# Patient Record
Sex: Male | Born: 1955 | Race: White | Hispanic: No | Marital: Married | Smoking: Never smoker
Health system: Southern US, Community
[De-identification: ages and names within clinical notes are randomized; demographics above are authoritative.]

## PROBLEM LIST (undated history)

## (undated) DIAGNOSIS — H3320 Serous retinal detachment, unspecified eye: Secondary | ICD-10-CM

## (undated) DIAGNOSIS — C801 Malignant (primary) neoplasm, unspecified: Secondary | ICD-10-CM

## (undated) DIAGNOSIS — H269 Unspecified cataract: Secondary | ICD-10-CM

## (undated) DIAGNOSIS — T7840XA Allergy, unspecified, initial encounter: Secondary | ICD-10-CM

## (undated) DIAGNOSIS — D649 Anemia, unspecified: Secondary | ICD-10-CM

## (undated) HISTORY — DX: Malignant (primary) neoplasm, unspecified: C80.1

## (undated) HISTORY — DX: Anemia, unspecified: D64.9

## (undated) HISTORY — PX: RETINAL DETACHMENT SURGERY: SHX105

## (undated) HISTORY — PX: TONSILLECTOMY: SUR1361

## (undated) HISTORY — DX: Allergy, unspecified, initial encounter: T78.40XA

## (undated) HISTORY — DX: Serous retinal detachment, unspecified eye: H33.20

## (undated) HISTORY — DX: Unspecified cataract: H26.9

---

## 2002-12-14 ENCOUNTER — Encounter: Payer: Self-pay | Admitting: Family Medicine

## 2004-02-05 ENCOUNTER — Ambulatory Visit: Payer: Self-pay | Admitting: Family Medicine

## 2004-07-24 ENCOUNTER — Ambulatory Visit: Payer: Self-pay | Admitting: Family Medicine

## 2005-03-03 ENCOUNTER — Encounter: Admission: RE | Admit: 2005-03-03 | Discharge: 2005-03-03 | Payer: Self-pay | Admitting: Occupational Medicine

## 2005-03-25 ENCOUNTER — Ambulatory Visit: Payer: Self-pay | Admitting: Family Medicine

## 2005-04-22 LAB — FECAL OCCULT BLOOD, GUAIAC: Fecal Occult Blood: NEGATIVE

## 2005-04-23 ENCOUNTER — Ambulatory Visit: Payer: Self-pay | Admitting: Family Medicine

## 2005-06-17 ENCOUNTER — Ambulatory Visit: Payer: Self-pay | Admitting: Family Medicine

## 2006-06-18 ENCOUNTER — Ambulatory Visit: Payer: Self-pay | Admitting: Family Medicine

## 2007-03-09 ENCOUNTER — Encounter: Payer: Self-pay | Admitting: Family Medicine

## 2007-04-08 ENCOUNTER — Ambulatory Visit: Payer: Self-pay | Admitting: Family Medicine

## 2007-04-08 DIAGNOSIS — D649 Anemia, unspecified: Secondary | ICD-10-CM

## 2007-04-11 ENCOUNTER — Encounter: Payer: Self-pay | Admitting: Family Medicine

## 2007-04-22 ENCOUNTER — Ambulatory Visit: Payer: Self-pay | Admitting: Family Medicine

## 2007-04-27 LAB — CONVERTED CEMR LAB
Basophils Relative: 0.8 % (ref 0.0–1.0)
Eosinophils Absolute: 0.1 10*3/uL (ref 0.0–0.6)
Eosinophils Relative: 3 % (ref 0.0–5.0)
Folate: 14.8 ng/mL
MCV: 83.4 fL (ref 78.0–100.0)
Monocytes Absolute: 0.3 10*3/uL (ref 0.2–0.7)
Monocytes Relative: 9.4 % (ref 3.0–11.0)
Neutro Abs: 1.6 10*3/uL (ref 1.4–7.7)
Neutrophils Relative %: 48.2 % (ref 43.0–77.0)
RDW: 15.2 % — ABNORMAL HIGH (ref 11.5–14.6)
Vitamin B-12: 439 pg/mL (ref 211–911)

## 2008-02-23 ENCOUNTER — Ambulatory Visit: Payer: Self-pay | Admitting: Family Medicine

## 2008-02-23 DIAGNOSIS — H5310 Unspecified subjective visual disturbances: Secondary | ICD-10-CM | POA: Insufficient documentation

## 2008-02-24 HISTORY — PX: CATARACT EXTRACTION: SUR2

## 2008-02-27 ENCOUNTER — Encounter: Payer: Self-pay | Admitting: Family Medicine

## 2008-02-27 LAB — CONVERTED CEMR LAB
BUN: 14 mg/dL (ref 6–23)
Basophils Relative: 0.5 % (ref 0.0–3.0)
CO2: 34 meq/L — ABNORMAL HIGH (ref 19–32)
Creatinine, Ser: 0.9 mg/dL (ref 0.4–1.5)
GFR calc Af Amer: 115 mL/min
Glucose, Bld: 110 mg/dL — ABNORMAL HIGH (ref 70–99)
Hemoglobin: 14.8 g/dL (ref 13.0–17.0)
LDL Cholesterol: 96 mg/dL (ref 0–99)
MCHC: 34.8 g/dL (ref 30.0–36.0)
Monocytes Absolute: 0.3 10*3/uL (ref 0.1–1.0)
Monocytes Relative: 7.4 % (ref 3.0–12.0)
Platelets: 150 10*3/uL (ref 150–400)
RDW: 13.6 % (ref 11.5–14.6)
Total CHOL/HDL Ratio: 2.6
WBC: 4.4 10*3/uL — ABNORMAL LOW (ref 4.5–10.5)

## 2008-03-01 ENCOUNTER — Telehealth: Payer: Self-pay | Admitting: Family Medicine

## 2008-03-06 ENCOUNTER — Encounter: Admission: RE | Admit: 2008-03-06 | Discharge: 2008-03-06 | Payer: Self-pay | Admitting: Family Medicine

## 2008-04-24 ENCOUNTER — Ambulatory Visit: Payer: Self-pay | Admitting: Family Medicine

## 2008-06-15 ENCOUNTER — Ambulatory Visit: Payer: Self-pay | Admitting: Family Medicine

## 2008-07-02 ENCOUNTER — Ambulatory Visit: Payer: Self-pay | Admitting: Ophthalmology

## 2008-09-03 ENCOUNTER — Ambulatory Visit: Payer: Self-pay | Admitting: Gastroenterology

## 2008-09-05 ENCOUNTER — Encounter: Payer: Self-pay | Admitting: Gastroenterology

## 2008-09-28 ENCOUNTER — Ambulatory Visit: Payer: Self-pay | Admitting: Gastroenterology

## 2009-03-12 ENCOUNTER — Ambulatory Visit: Payer: Self-pay | Admitting: Family Medicine

## 2009-03-14 LAB — CONVERTED CEMR LAB
Basophils Absolute: 0 10*3/uL (ref 0.0–0.1)
HCT: 41.3 % (ref 39.0–52.0)
Lymphocytes Relative: 32.7 % (ref 12.0–46.0)
MCHC: 33.3 g/dL (ref 30.0–36.0)
Monocytes Absolute: 0.3 10*3/uL (ref 0.1–1.0)
Monocytes Relative: 6.6 % (ref 3.0–12.0)
Neutro Abs: 2.2 10*3/uL (ref 1.4–7.7)

## 2009-07-25 ENCOUNTER — Ambulatory Visit: Payer: Self-pay | Admitting: Family Medicine

## 2009-07-29 ENCOUNTER — Ambulatory Visit: Payer: Self-pay | Admitting: Family Medicine

## 2009-07-30 LAB — CONVERTED CEMR LAB
ALT: 16 units/L (ref 0–53)
AST: 22 units/L (ref 0–37)
Albumin: 4 g/dL (ref 3.5–5.2)
Basophils Absolute: 0 10*3/uL (ref 0.0–0.1)
Bilirubin, Direct: 0.2 mg/dL (ref 0.0–0.3)
CO2: 31 meq/L (ref 19–32)
GFR calc non Af Amer: 96.75 mL/min (ref >60)
HDL: 68.8 mg/dL (ref >39.00)
Hemoglobin: 13 g/dL (ref 13.0–17.0)
Lymphocytes Relative: 30.6 % (ref 12.0–46.0)
PSA: 1.1 ng/mL (ref 0.10–4.00)
Platelets: 139 10*3/uL — ABNORMAL LOW (ref 150.0–400.0)
RBC: 4.36 M/uL (ref 4.22–5.81)
RDW: 14.8 % — ABNORMAL HIGH (ref 11.5–14.6)
Sodium: 144 meq/L (ref 135–145)
Total Protein: 6.2 g/dL (ref 6.0–8.3)
VLDL: 5.4 mg/dL (ref 0.0–40.0)
WBC: 4.4 10*3/uL — ABNORMAL LOW (ref 4.5–10.5)

## 2009-07-31 ENCOUNTER — Ambulatory Visit: Payer: Self-pay | Admitting: Family Medicine

## 2009-07-31 DIAGNOSIS — J45909 Unspecified asthma, uncomplicated: Secondary | ICD-10-CM

## 2010-03-23 LAB — CONVERTED CEMR LAB
Albumin: 4.2 g/dL (ref 3.5–5.2)
Alkaline Phosphatase: 32 units/L — ABNORMAL LOW (ref 39–117)
Bilirubin, Direct: 0.2 mg/dL (ref 0.0–0.3)
Calcium: 9 mg/dL (ref 8.4–10.5)
Creatinine, Ser: 0.9 mg/dL (ref 0.40–1.50)
Eosinophils Relative: 3 % (ref 0–5)
Indirect Bilirubin: 0.5 mg/dL (ref 0.0–0.9)
Lymphocytes Relative: 30 % (ref 12–46)
Lymphs Abs: 1.3 10*3/uL (ref 0.7–4.0)
MCHC: 34.3 g/dL (ref 30.0–36.0)
Monocytes Absolute: 0.3 10*3/uL (ref 0.1–1.0)
Monocytes Relative: 7 % (ref 3–12)
Neutro Abs: 2.7 10*3/uL (ref 1.7–7.7)
Neutrophils Relative %: 60 % (ref 43–77)
PSA: 1 ng/mL (ref 0.10–4.00)
Sodium: 140 meq/L (ref 135–145)
Total Bilirubin: 0.7 mg/dL (ref 0.3–1.2)
WBC: 4.5 10*3/uL (ref 4.0–10.5)

## 2010-03-25 NOTE — Assessment & Plan Note (Signed)
Summary: CPX/CLE   Vital Signs:  Patient profile:   55 year old male Height:      69 inches Weight:      146.75 pounds BMI:     21.75 Temp:     97.8 degrees F oral Pulse rate:   60 / minute Pulse rhythm:   regular BP sitting:   100 / 70  (left arm) Cuff size:   regular  Vitals Entered By: Lewanda Rife LPN (July 31, 8117 9:53 AM) CC: CPX   History of Present Illness: here for health mt exam and to rev chronic med problems   feels well overall  no new problems or questions    wt is down 2 lb  bp great 100/70  lipids very good with trig 27/ HDL 68 and LDL90- great   hb 13- no anemia -- does still donate blood  platelets 139- this is stable for him  no unusual bruising or bleeding   nl psa  no prostate problems/ urinary changes or nocturia   colonosc nl 8/10- rec 10 y f/u   Td 09  feels like advair helped -- and needs to go back on it  some wheezing at night and in extreme temps   Allergies (verified): No Known Drug Allergies  Past History:  Past Medical History: Last updated: 02/23/2008 Allergic rhinitis Asthma mild anemia from blood donation  Past Surgical History: Last updated: 03/07/2008 MRI/MRA brain (01/10)- no vascular changes (some chronic sinus changes)  Family History: Last updated: 07/31/2009 Father:  Mother:  Siblings: 7 sister had brain aneurysm -- she died  brother has cancer -- lung (smoker)   Social History: Last updated: 02/23/2008 Marital Status: Married Children: 1 daughter Occupation: copy Programmer, 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 Eyes:  Denies blurring and eye pain. CV:  Denies chest pain or discomfort, palpitations, shortness of breath with exertion,  and swelling of feet. Resp:  Denies cough, pleuritic, and wheezing. GI:  Denies abdominal pain, change in bowel habits, indigestion, nausea, and vomiting. GU:  Denies dysuria, nocturia, urinary frequency, and urinary hesitancy. MS:  Denies joint pain, joint redness, joint swelling, and cramps. Derm:  Denies itching, lesion(s), poor wound healing, and rash. Neuro:  Denies numbness and tingling. Psych:  Denies anxiety and depression. Endo:  Denies cold intolerance, excessive thirst, excessive urination, and heat intolerance. Heme:  Denies abnormal bruising and bleeding.  Physical Exam  General:  Well-developed,well-nourished,in no acute distress; alert,appropriate and cooperative throughout examination Head:  normocephalic, atraumatic, and no abnormalities observed.   Eyes:  vision grossly intact, pupils equal, pupils round, and pupils reactive to light.  no conjunctival pallor, injection or icterus  Ears:  R ear normal and L ear normal.   Nose:  nares are boggy but clear  Mouth:  pharynx pink and moist, no erythema, and no exudates.   Neck:  supple with full rom and no masses or thyromegally, no JVD or carotid bruit  Chest Wall:  No deformities, masses, tenderness or gynecomastia noted. Lungs:  Normal respiratory effort, chest expands symmetrically. Lungs are clear to auscultation, no crackles or wheezes. Heart:  Normal rate and regular rhythm. S1 and S2 normal without gallop, murmur, click, rub or other extra sounds. Abdomen:  Bowel sounds positive,abdomen soft and non-tender without masses, organomegaly or hernias noted. no renal bruits  Rectal:  No external abnormalities noted. Normal sphincter tone. No rectal masses or tenderness. Prostate:  Prostate gland firm and smooth, no enlargement, nodularity, tenderness, mass, asymmetry or induration. Msk:  No deformity or scoliosis noted of thoracic or lumbar spine.  no acute joint changes  Pulses:  R and L carotid,radial,femoral,dorsalis pedis  and posterior tibial pulses are full and equal bilaterally Extremities:  No clubbing, cyanosis, edema, or deformity noted with normal full range of motion of all joints.   Neurologic:  sensation intact to light touch, gait normal, and DTRs symmetrical and normal.      Impression & Recommendations:  Problem # 1:  HEALTH MAINTENANCE EXAM (ICD-V70.0) Assessment Comment Only reviewed health habits including diet, exercise and skin cancer prevention reviewed health maintenance list and family history  commended on good habits   Problem # 2:  SPECIAL SCREENING MALIGNANT NEOPLASM OF PROSTATE (ICD-V76.44) Assessment: Comment Only exam done and nl psa no symptoms   Problem # 3:  ASTHMA (ICD-493.90) Assessment: Deteriorated triggered by temp / sometimes exercise / night  re start advair and update disc use of rescue inhaler as needed when needed  His updated medication list for this problem includes:    Advair Diskus 100-50 Mcg/dose Aepb (Fluticasone-salmeterol) .Marland Kitchen... 1 inhalation two times a day as directed  Complete Medication List: 1)  Flonase 50 Mcg/act Susp (Fluticasone propionate) .... 2 sprays in each nostril once daily 2)  Claritin 10 Mg Tabs (Loratadine) .... One by mouth daily as needed 3)  Aspirin 81 Mg Tabs (Aspirin) .... Take 1 tablet by mouth once a day 4)  Advair Diskus 100-50 Mcg/dose Aepb (Fluticasone-salmeterol) .Marland Kitchen.. 1 inhalation two times a day as directed  Patient Instructions: 1)  keep up the good work with healthy diet and exercise  2)  no change in medicines  3)  go back on advair and update me if no improvement  Prescriptions: ADVAIR DISKUS 100-50 MCG/DOSE AEPB (FLUTICASONE-SALMETEROL) 1 inhalation two times a day as directed  #3 diskus x 3   Entered and Authorized by:   Judith Part MD   Signed by:   Judith Part MD on 07/31/2009   Method used:   Print then Give to Patient   RxID:   [id number] FLONASE 50 MCG/ACT  SUSP (FLUTICASONE PROPIONATE) 2  sprays in each nostril once daily  #3 mdi x 3   Entered and Authorized by:   Judith Part MD   Signed by:   Judith Part MD on 07/31/2009   Method used:   Print then Give to Patient   RxID:   [id number]   Current Allergies (reviewed today): No known allergies

## 2010-03-25 NOTE — Assessment & Plan Note (Signed)
Summary: right elbow per dr. Scotty Court   Vital Signs:  Patient profile:   55 year old male Height:      69 inches Weight:      148.6 pounds BMI:     22.02 Temp:     98.2 degrees F oral Pulse rate:   68 / minute Pulse rhythm:   regular BP sitting:   110 / 72  (left arm) Cuff size:   regular  Vitals Entered By: Benny Lennert CMA Duncan Dull) (July 25, 2009 11:56 AM)  History of Present Illness: Chief complaint Right elbow pain  55 year old male with R elbow pain:  patient seen at the request of Dr. Milinda Antis for evaluation of right elbow pain, and has been ongoing for 6 months. He was doing some home placing him chin-ups, and at that time he felt a significant amount of right elbow assertional  pain at the medial upper condyle. Since that time he has had continued pain and some dysfunction  at the medial upper condyle, particular pronation and pain in the flexor tendon.  He is trying to modify her activity somewhat, but his lifting technique, and continues to have some difficulty.    REVIEW OF SYSTEMS  GEN: No systemic complaints, no fevers, chills, sweats, or other acute illnesses MSK: Detailed in the HPI GI: tolerating PO intake without difficulty Neuro: No numbness, parasthesias, or tingling associated. Otherwise the pertinent positives of the ROS are noted above.    Allergies (verified): No Known Drug Allergies  Past History:  Past medical, surgical, family and social histories (including risk factors) reviewed, and no changes noted (except as noted below).  Past Medical History: Reviewed history from 02/23/2008 and no changes required. Allergic rhinitis Asthma mild anemia from blood donation  Past Surgical History: Reviewed history from 03/07/2008 and no changes required. MRI/MRA brain (01/10)- no vascular changes (some chronic sinus changes)  Family History: Reviewed history from 02/23/2008 and no changes required. Father:  Mother:  Siblings: 7 sister had brain  aneurysm   Social History: Reviewed history from 02/23/2008 and no changes required. 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 No apparent alopecia or balding. Ears, externally no deformities PULM: Breathing comfortably in no respiratory distress EXT: No clubbing, cyanosis, or edema PSYCH: Normally interactive. Cooperative during the interview. Pleasant. Friendly and conversant. Not anxious or depressed appearing. Normal, full affect.   Msk:  left elbow: Full range of motion at the elbow with flexion, extension pronation and supination. Strength is 5/5 in his elbow. Nontender at the medial or lateral epicondyles. Grossly normal examination.  Right elbow: Full range of motion with extension, flexion, pronation  and supination. Stable to valgus stress. Tinel's at the elbow is negative. Nontender at the lateral epicondyles lateral epicondylar insertion at the ecrb. Patient does have tenderness at the common flexor tendon insertion at the medial epicondyle  he also has pain at the active pronation   Impression & Recommendations:  Problem # 1:  MEDIAL EPICONDYLITIS, RIGHT (ICD-726.31) Assessment New >25 minutes spent in face to face time with patient, >50% spent in counselling or coordination of care  classic history and examination. I suspected this is a case of the patient had a partial  rupture or partial  insertional  rupture at his medial upper condyle  and had this  initial episode of significant pain. Since that time I would suspect that he has had healing or partial healing with deposition of calcium and now has developed some chronic tendinopathy and weakness in this area.  I reviewed the relevent anatomy  with the patient using [employer] of Human Anatomy. The patient indicated that they understood the involved structures.   Pt. given a formal rehab program from Dr. Caralee Ates at [facility] and the [facility] on elbow rehabiliation.  Series of concentric and eccentric exercises should be done starting with no weight, work up to 1 lb, hammer, etc. Use counterforce strap if working or using hands: Aircast band given  Overall, mild case. f/u if no improvement.  Complete Medication List: 1)  Flonase 50 Mcg/act Susp (Fluticasone propionate) .... Use as directed 2)  Claritin 10 Mg Tabs (Loratadine) .... One by mouth daily as needed 3)  Aspirin 81 Mg Tabs (Aspirin) .... Take 1 tablet by mouth once a day  Current Allergies (reviewed today): No known allergies

## 2010-03-25 NOTE — Assessment & Plan Note (Signed)
Summary: SWALLOW AREA BEHIND EAR  CYD   Vital Signs:  Patient profile:   55 year old male Weight:      153 pounds Temp:     97.9 degrees F oral Pulse rate:   68 / minute Pulse rhythm:   regular BP sitting:   110 / 78  (left arm) Cuff size:   regular  Vitals Entered By: Lowella Petties CMA (March 12, 2009 3:02 PM) CC: Swollen area behind right ear x 6 weeks.   History of Present Illness: had a spot come up behind his ear -- late november -- watched it for a while  is not getting bigger does not hurt or itch  no ear problems - or pain did have a cold and got over that   no other lumps or spots   feels ok - no fever   had an itchy spot on base of penis -- last few days -- used antifungal and it is getting better    Allergies: No Known Drug Allergies  Past History:  Past Medical History: Last updated: 02/23/2008 Allergic rhinitis Asthma mild anemia from blood donation  Past Surgical History: Last updated: 03/07/2008 MRI/MRA brain (01/10)- no vascular changes (some chronic sinus changes)  Family History: Last updated: 02/23/2008 Father:  Mother:  Siblings: 7 sister had brain aneurysm   Social History: Last updated: 02/23/2008 Marital Status: Married Children: 1 daughter Occupation: copy Programmer, 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 Eyes:  Denies blurring, discharge, and eye irritation. ENT:  Denies ear discharge, earache, postnasal drainage, sinus pressure, and sore throat. CV:  Denies chest pain or discomfort and palpitations. Resp:  Denies cough. GI:  Denies abdominal pain and change in bowel habits. GU:  Denies urinary frequency. MS:  Denies joint pain. Derm:  Denies itching, lesion(s), poor wound healing, and rash. Heme:  Denies abnormal bruising, bleeding, fevers, pallor, and skin  discoloration.  Physical Exam  General:  Well-developed,well-nourished,in no acute distress; alert,appropriate and cooperative throughout examination Head:  normocephalic, atraumatic, and no abnormalities observed.  no sinus tenderness  Eyes:  vision grossly intact, pupils equal, pupils round, and pupils reactive to light.  no conjunctival pallor, injection or icterus  Ears:  R ear normal and L ear normal.   Nose:  no nasal discharge.   Mouth:  pharynx pink and moist.   Neck:  soft 1 cm mass- mobile and nontender ,behind R ear  no color or skin change is just under divot from arm of glasses  Chest Wall:  No deformities, masses, tenderness or gynecomastia noted. Lungs:  Normal respiratory effort, chest expands symmetrically. Lungs are clear to auscultation, no crackles or wheezes. Heart:  Normal rate and regular rhythm. S1 and S2 normal without gallop, murmur, click, rub or other extra sounds. Abdomen:  Bowel sounds positive,abdomen soft and non-tender without masses, organomegaly or hernias noted. Msk:  No deformity or scoliosis noted of thoracic or lumbar spine.   Skin:  small 3-4 mm erythematous papule / some central clearing on L shaft of penis no other rash Cervical Nodes:  see neck exam  Axillary Nodes:  No palpable lymphadenopathy Inguinal Nodes:  No significant adenopathy Psych:  normal affect, talkative and pleasant    Impression & Recommendations:  Problem # 1:  NEOPLASMS UNSPEC NATURE BONE SOFT TISSUE&SKIN (ICD-239.2) Assessment New  lump behind R ear -- differential includes  lymph node / lipoma/ or scar tissue from arm of glasses  no other findings  no adenopathy elsewhere check cbc today  continue to monitor for growth or change -- consider ENT ref if bigger   Orders: Venipuncture (69629) TLB-CBC Platelet - w/Differential (85025-CBCD)  Problem # 2:  TINEA CORPORIS (ICD-110.5) Assessment: New small area on shaft of penis - appears to be resolving with otc cream  antifungal adv to continue this and keep as dry as possible update if not gone in 2 wk  Complete Medication List: 1)  Flonase 50 Mcg/act Susp (Fluticasone propionate) .... Use as directed 2)  Claritin 10 Mg Tabs (Loratadine) .... One by mouth daily as needed 3)  Aspirin 81 Mg Tabs (Aspirin) .... Take 1 tablet by mouth once a day  Patient Instructions: 1)  soft tissue mass behind ear may be lymph node/ scar tissue or fatty deposit 2)  will check labs today- complete blood count - and update you with result  3)  watch closely for change or growth or pain  4)  follow up in about 3 months for re check 5)  continue using antifungal cream on area of rash- keep it clean and dry   Prior Medications (reviewed today): FLONASE 50 MCG/ACT  SUSP (FLUTICASONE PROPIONATE) use as directed CLARITIN 10 MG TABS (LORATADINE) one by mouth daily as needed ASPIRIN 81 MG  TABS (ASPIRIN) Take 1 tablet by mouth once a day Current Allergies: No known allergies

## 2010-04-04 ENCOUNTER — Ambulatory Visit: Payer: Self-pay | Admitting: Ophthalmology

## 2010-05-24 ENCOUNTER — Encounter: Payer: Self-pay | Admitting: Family Medicine

## 2010-05-27 ENCOUNTER — Ambulatory Visit (INDEPENDENT_AMBULATORY_CARE_PROVIDER_SITE_OTHER): Payer: BC Managed Care – PPO | Admitting: Family Medicine

## 2010-05-27 ENCOUNTER — Encounter: Payer: Self-pay | Admitting: Family Medicine

## 2010-05-27 VITALS — BP 124/72 | HR 72 | Temp 98.2°F | Resp 20 | Ht 68.0 in | Wt 150.8 lb

## 2010-05-27 DIAGNOSIS — Z0100 Encounter for examination of eyes and vision without abnormal findings: Secondary | ICD-10-CM

## 2010-05-27 DIAGNOSIS — H3320 Serous retinal detachment, unspecified eye: Secondary | ICD-10-CM

## 2010-05-27 DIAGNOSIS — Z0289 Encounter for other administrative examinations: Secondary | ICD-10-CM | POA: Insufficient documentation

## 2010-05-27 DIAGNOSIS — Z0110 Encounter for examination of ears and hearing without abnormal findings: Secondary | ICD-10-CM

## 2010-05-27 DIAGNOSIS — Z0000 Encounter for general adult medical examination without abnormal findings: Secondary | ICD-10-CM

## 2010-05-27 LAB — POCT URINALYSIS DIPSTICK: Leukocytes, UA: NEGATIVE

## 2010-05-27 NOTE — Progress Notes (Signed)
Subjective:    Patient ID: Matthew Snyder, male    DOB: Apr 23, 1955, 55 y.o.   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 Allergic rhinitis   . Asthma   . Anemia     mild, from blood transfusion  . Retinal detachment    Past Surgical History  Procedure Date  . Retinal detachment surgery     reports that he has never smoked. He does not have any smokeless tobacco history on file. His alcohol and drug histories not on file. family history includes Aneurysm in his sister and Tracheal cancer in his brother. No Known Allergies  History   Social History  . Marital Status: Married    Spouse Name: N/A    Number of Children: 1  . Years of Education: N/A   Occupational History  . copy editor    Social History Main Topics  . Smoking status: Never Smoker   . Smokeless tobacco: Not on file  . Alcohol Use: Not on file     rare  . Drug Use: Not on file  . Sexually Active: Not on file   Other Topics Concern  . Not on file   Social History Narrative   Regular exercise.--runs a lot Runs 1/2 marathons     Family History  Problem Relation Age of Onset  . Aneurysm Sister     brain  . Tracheal cancer Brother     lung, smoker       Review of Systems  Constitutional: Negative for activity change, appetite change, fatigue and unexpected weight change.  Eyes: Positive for visual disturbance. Negative for pain, discharge  and redness.  Respiratory: Negative for cough, shortness of breath and wheezing.   Cardiovascular: Negative.   Gastrointestinal: Negative for nausea, abdominal pain and diarrhea.  Genitourinary: Positive for frequency. Negative for urgency.  Musculoskeletal: Negative for myalgias, back pain and arthralgias.  Skin: Negative for color change, pallor and wound.  Neurological: Negative for tremors, weakness, light-headedness and headaches.  Hematological: Negative for adenopathy. Does not bruise/bleed easily.  Psychiatric/Behavioral: Negative for dysphoric mood. The patient is not nervous/anxious.        Objective:   Physical Exam  Constitutional: He appears well-developed and well-nourished. No distress.  HENT:  Head: Normocephalic and atraumatic.  Right Ear: External ear normal.  Left Ear: External ear normal.  Nose: Nose normal.  Mouth/Throat: Oropharynx is clear and moist.  Eyes: Conjunctivae and EOM are normal. Pupils are equal, round, and reactive to light.  Neck: Normal range of motion. Neck supple. No JVD present. No thyromegaly present.  Cardiovascular: Normal rate, regular rhythm and normal heart sounds.   Pulmonary/Chest: Effort normal and breath sounds normal. No respiratory distress. He has no wheezes. He exhibits no tenderness.  Abdominal: Soft. Bowel sounds are normal. He  exhibits no distension and no mass.  Musculoskeletal: Normal range of motion. He exhibits no tenderness.  Lymphadenopathy:    He has no cervical adenopathy.  Neurological: He is alert. He displays normal reflexes. No cranial nerve deficit. He exhibits normal muscle tone.  Skin: Skin is warm. No rash noted. No erythema. No pallor.  Psychiatric: He has a normal mood and affect.          Assessment & Plan:

## 2010-05-27 NOTE — Assessment & Plan Note (Signed)
Exam for nursing school No restritctions above any opthy restrictions for detatched retina-- expect further improvement in vision  ua nl / hearing nl  Rev nl labs from last June  Getting imms from health dept

## 2010-05-27 NOTE — Patient Instructions (Signed)
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

## 2010-05-30 ENCOUNTER — Encounter: Payer: Self-pay | Admitting: Family Medicine

## 2010-05-30 DIAGNOSIS — Z8669 Personal history of other diseases of the nervous system and sense organs: Secondary | ICD-10-CM | POA: Insufficient documentation

## 2010-08-08 ENCOUNTER — Other Ambulatory Visit: Payer: Self-pay | Admitting: *Deleted

## 2010-08-08 MED ORDER — FLUTICASONE-SALMETEROL 100-50 MCG/DOSE IN AEPB: 1.0000 | INHALATION_SPRAY | Freq: Two times a day (BID) | RESPIRATORY_TRACT | Status: DC

## 2010-12-11 ENCOUNTER — Encounter: Payer: Self-pay | Admitting: Family Medicine

## 2011-03-09 ENCOUNTER — Other Ambulatory Visit: Payer: Self-pay | Admitting: Internal Medicine

## 2011-03-09 MED ORDER — FLUTICASONE-SALMETEROL 100-50 MCG/DOSE IN AEPB: 1.0000 | INHALATION_SPRAY | Freq: Two times a day (BID) | RESPIRATORY_TRACT | Status: DC

## 2011-03-09 NOTE — Telephone Encounter (Signed)
Will send to [facility] electronically-let him know it was sent ,thanks

## 2011-03-09 NOTE — Telephone Encounter (Signed)
Patient's wife notified as instructed by telephone. 

## 2011-03-09 NOTE — Telephone Encounter (Signed)
Patient would like this sent to [facility] because he can receive it cheaper and would like a 90 day supply

## 2011-09-27 ENCOUNTER — Telehealth: Payer: Self-pay | Admitting: Family Medicine

## 2011-09-27 DIAGNOSIS — N400 Enlarged prostate without lower urinary tract symptoms: Secondary | ICD-10-CM | POA: Insufficient documentation

## 2011-09-27 DIAGNOSIS — Z0000 Encounter for general adult medical examination without abnormal findings: Secondary | ICD-10-CM | POA: Insufficient documentation

## 2011-09-27 DIAGNOSIS — Z125 Encounter for screening for malignant neoplasm of prostate: Secondary | ICD-10-CM

## 2011-09-27 NOTE — Telephone Encounter (Signed)
Message copied by Judy Pimple on Sun Sep 27, 2011  9:25 PM ------      Message from: Alvina Chou      Created: Tue Sep 22, 2011  3:35 PM      Regarding: Lab orders for, Monday 8.5.13       Patient is scheduled for CPX labs, please order future labs, Thanks , Camelia Eng

## 2011-10-02 ENCOUNTER — Other Ambulatory Visit (INDEPENDENT_AMBULATORY_CARE_PROVIDER_SITE_OTHER): Payer: BC Managed Care – PPO

## 2011-10-02 DIAGNOSIS — Z125 Encounter for screening for malignant neoplasm of prostate: Secondary | ICD-10-CM

## 2011-10-02 DIAGNOSIS — Z0000 Encounter for general adult medical examination without abnormal findings: Secondary | ICD-10-CM

## 2011-10-02 LAB — CBC WITH DIFFERENTIAL/PLATELET
HCT: 42.2 % (ref 39.0–52.0)
Hemoglobin: 14 g/dL (ref 13.0–17.0)
MCV: 88.4 fl (ref 78.0–100.0)
Monocytes Relative: 9.9 % (ref 3.0–12.0)
Platelets: 134 10*3/uL — ABNORMAL LOW (ref 150.0–400.0)
RBC: 4.77 Mil/uL (ref 4.22–5.81)
RDW: 14.5 % (ref 11.5–14.6)
WBC: 3.5 10*3/uL — ABNORMAL LOW (ref 4.5–10.5)

## 2011-10-02 LAB — TSH: TSH: 2.42 u[IU]/mL (ref 0.35–5.50)

## 2011-10-02 LAB — COMPREHENSIVE METABOLIC PANEL
AST: 24 U/L (ref 0–37)
Alkaline Phosphatase: 31 U/L — ABNORMAL LOW (ref 39–117)
Calcium: 9 mg/dL (ref 8.4–10.5)
Chloride: 102 mEq/L (ref 96–112)
Creatinine, Ser: 0.8 mg/dL (ref 0.4–1.5)
Glucose, Bld: 88 mg/dL (ref 70–99)
Potassium: 3.9 mEq/L (ref 3.5–5.1)
Total Bilirubin: 0.9 mg/dL (ref 0.3–1.2)

## 2011-10-02 LAB — LIPID PANEL
LDL Cholesterol: 88 mg/dL (ref 0–99)
Triglycerides: 35 mg/dL (ref 0.0–149.0)
VLDL: 7 mg/dL (ref 0.0–40.0)

## 2011-10-09 ENCOUNTER — Ambulatory Visit (INDEPENDENT_AMBULATORY_CARE_PROVIDER_SITE_OTHER): Payer: BC Managed Care – PPO | Admitting: Family Medicine

## 2011-10-09 ENCOUNTER — Encounter: Payer: Self-pay | Admitting: Family Medicine

## 2011-10-09 VITALS — BP 102/64 | HR 64 | Temp 97.9°F | Ht 68.0 in | Wt 145.2 lb

## 2011-10-09 DIAGNOSIS — Z125 Encounter for screening for malignant neoplasm of prostate: Secondary | ICD-10-CM

## 2011-10-09 DIAGNOSIS — Z0000 Encounter for general adult medical examination without abnormal findings: Secondary | ICD-10-CM

## 2011-10-09 NOTE — Patient Instructions (Addendum)
Labs are stable  Cholesterol is very good  Remember to eat regular meals , even when busy or stressed  Keep exercising  Update me if urinary status changes please

## 2011-10-09 NOTE — Progress Notes (Signed)
Subjective:    Patient ID: Matthew Snyder, male    DOB: 1955-11-07, 56 y.o.   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   psa is 1.98- in nl limits Still has some frequency of urination -- is aware of it , but not worse  No urol visit  Last was 1.1  colonosc 8/10  Lab Results  Component Value Date   WBC 3.5* 10/02/2011   HGB 14.0 10/02/2011   HCT 42.2 10/02/2011   MCV 88.4 10/02/2011   PLT 134.0* 10/02/2011   platelets are stable No major bruising and bleeding     Diet-is eating well and healthy   Exercise- is still running - up to 10 miles   Lab Results  Component Value Date   CHOL 168 10/02/2011   CHOL 164 07/29/2009   CHOL 172 02/23/2008   Lab Results  Component Value Date   HDL 73.00 10/02/2011   HDL 68.80 07/29/2009   HDL 66.0 02/23/2008   Lab Results  Component Value Date   LDLCALC 88 10/02/2011   LDLCALC 90 07/29/2009   LDLCALC 96 02/23/2008   Lab Results  Component Value Date   TRIG 35.0 10/02/2011   TRIG 27.0 07/29/2009   TRIG 49 02/23/2008   Lab Results  Component Value Date   CHOLHDL 2 10/02/2011   CHOLHDL 2 07/29/2009   CHOLHDL 2.6 CALC 02/23/2008   No results found for this basename: LDLDIRECT     Patient Active Problem List  Diagnosis  . ANEMIA  . UNSPECIFIED SUBJECTIVE VISUAL DISTURBANCE  . ASTHMA  . Other general medical examination for administrative purposes  . Retinal detachment  . Routine general medical examination at a health care facility  . Prostate cancer screening   Past Medical History  Diagnosis Date  . Allergic rhinitis   . Asthma   . Anemia     mild, from blood transfusion  . Retinal detachment    Past Surgical History  Procedure Date  . Retinal detachment surgery     History  Substance Use Topics  . Smoking status: Never Smoker   . Smokeless tobacco: Not on file  . Alcohol Use: Yes     rare   Family History  Problem Relation Age of Onset  . Aneurysm Sister     brain  . Tracheal cancer Brother     lung, smoker   No Known Allergies Current Outpatient Prescriptions on File Prior to Visit  Medication Sig Dispense Refill  . aspirin 81 MG tablet Take 81 mg by mouth daily.        . fluticasone (FLONASE) 50 MCG/ACT nasal spray 2 sprays in each nostril daily       . Fluticasone-Salmeterol (ADVAIR DISKUS) 100-50 MCG/DOSE AEPB Inhale 1 puff into the lungs 2 (two) times daily.  3 each  3  . loratadine (CLARITIN) 10 MG tablet Take 10 mg by mouth daily.             Review of Systems Review of Systems  Constitutional: Negative for fever, appetite change, fatigue and unexpected weight change.  Eyes: Negative for pain and visual disturbance.  Respiratory: Negative for cough and  shortness of breath.   Cardiovascular: Negative for cp or palpitations    Gastrointestinal: Negative for nausea, diarrhea and constipation.  Genitourinary: Negative for urgency and no change in frequency  Skin: Negative for pallor or rash   Neurological: Negative for weakness, light-headedness, numbness and headaches.  Hematological: Negative for adenopathy. Does not bruise/bleed easily.  Psychiatric/Behavioral: Negative for dysphoric mood. The patient is not nervous/anxious.         Objective:   Physical Exam  Constitutional: He appears well-developed and well-nourished. No distress.  HENT:  Head: Normocephalic and atraumatic.  Right Ear: External ear normal.  Left Ear: External ear normal.  Nose: Nose normal.  Mouth/Throat: Oropharynx is clear and moist.  Eyes: Conjunctivae and EOM are normal. Pupils are equal, round, and reactive to light. No scleral icterus.  Neck: Normal range of motion. Neck supple. No JVD present. Carotid bruit is not present. No thyromegaly  present.  Cardiovascular: Normal rate, regular rhythm, normal heart sounds and intact distal pulses.  Exam reveals no gallop.   Pulmonary/Chest: Effort normal and breath sounds normal. No respiratory distress. He has no wheezes.  Abdominal: Soft. Bowel sounds are normal. He exhibits no distension, no abdominal bruit and no mass. There is no tenderness.  Genitourinary: Rectum normal. Rectal exam shows no mass, no tenderness and anal tone normal. Prostate is enlarged. Prostate is not tender.       Prostate mildly enlarged - baseline No tenderness Firm/ symmetric  Musculoskeletal: Normal range of motion. He exhibits no edema and no tenderness.  Lymphadenopathy:    He has no cervical adenopathy.  Neurological: He is alert. He has normal reflexes. No cranial nerve deficit. He exhibits normal muscle tone. Coordination normal.  Skin: Skin is warm and dry. No rash noted. No erythema. No pallor.  Psychiatric: He has a normal mood and affect.          Assessment & Plan:

## 2011-10-11 NOTE — Assessment & Plan Note (Signed)
psa is wnl  Mild BPH Not sympt enough to require medicine Will continue to follow

## 2011-10-11 NOTE — Assessment & Plan Note (Signed)
Reviewed health habits including diet and exercise and skin cancer prevention Also reviewed health mt list, fam hx and immunizations   Rev wellness labs with pt  

## 2012-08-30 ENCOUNTER — Other Ambulatory Visit: Payer: Self-pay

## 2012-08-30 MED ORDER — FLUTICASONE-SALMETEROL 100-50 MCG/DOSE IN AEPB: 1.0000 | INHALATION_SPRAY | Freq: Two times a day (BID) | RESPIRATORY_TRACT | Status: DC

## 2012-08-30 NOTE — Telephone Encounter (Signed)
Pt left v/m requesting written 90 day rx for Advair. Call pt when ready for pick up.

## 2012-08-30 NOTE — Telephone Encounter (Signed)
Px printed for pick up in IN box  

## 2012-08-30 NOTE — Telephone Encounter (Signed)
Pt's wife notified Rx ready for pick-up 

## 2013-01-25 ENCOUNTER — Other Ambulatory Visit: Payer: Self-pay

## 2013-01-25 MED ORDER — FLUTICASONE-SALMETEROL 100-50 MCG/DOSE IN AEPB: 1.0000 | INHALATION_SPRAY | Freq: Two times a day (BID) | RESPIRATORY_TRACT | Status: DC

## 2013-01-25 NOTE — Telephone Encounter (Signed)
Px printed for pick up in IN box  

## 2013-01-25 NOTE — Telephone Encounter (Signed)
Pt left v/m; pt has changed employers and has new insurance. Pt request 90 day written rx for Advair. Call pt when rx ready for pick up. Pt last seen 10/09/2011; no future appt scheduled.

## 2013-01-25 NOTE — Telephone Encounter (Signed)
Pt advise Rx ready for pick-up 

## 2014-05-02 ENCOUNTER — Telehealth: Payer: Self-pay | Admitting: Family Medicine

## 2014-05-02 DIAGNOSIS — N400 Enlarged prostate without lower urinary tract symptoms: Secondary | ICD-10-CM

## 2014-05-02 DIAGNOSIS — Z0000 Encounter for general adult medical examination without abnormal findings: Secondary | ICD-10-CM

## 2014-05-02 NOTE — Telephone Encounter (Signed)
-----   Message from Ellamae Sia sent at 05/02/2014 12:42 PM EST ----- Regarding: Lab orders for Thursday, 3.10.16 Patient is scheduled for CPX labs, please order future labs, Thanks , Karna Christmas

## 2014-05-04 ENCOUNTER — Other Ambulatory Visit (INDEPENDENT_AMBULATORY_CARE_PROVIDER_SITE_OTHER): Payer: BC Managed Care – PPO

## 2014-05-04 DIAGNOSIS — N400 Enlarged prostate without lower urinary tract symptoms: Secondary | ICD-10-CM

## 2014-05-04 DIAGNOSIS — Z0000 Encounter for general adult medical examination without abnormal findings: Secondary | ICD-10-CM

## 2014-05-04 LAB — CBC WITH DIFFERENTIAL/PLATELET
BASOS PCT: 0.8 % (ref 0.0–3.0)
Basophils Absolute: 0 10*3/uL (ref 0.0–0.1)
EOS PCT: 3.9 % (ref 0.0–5.0)
Eosinophils Absolute: 0.1 10*3/uL (ref 0.0–0.7)
HCT: 39.6 % (ref 39.0–52.0)
HEMOGLOBIN: 13.6 g/dL (ref 13.0–17.0)
LYMPHS ABS: 1.4 10*3/uL (ref 0.7–4.0)
Lymphocytes Relative: 39.1 % (ref 12.0–46.0)
MCHC: 34.3 g/dL (ref 30.0–36.0)
MCV: 85.7 fl (ref 78.0–100.0)
MONOS PCT: 7.9 % (ref 3.0–12.0)
Monocytes Absolute: 0.3 10*3/uL (ref 0.1–1.0)
NEUTROS PCT: 48.3 % (ref 43.0–77.0)
Neutro Abs: 1.8 10*3/uL (ref 1.4–7.7)
PLATELETS: 156 10*3/uL (ref 150.0–400.0)
RBC: 4.62 Mil/uL (ref 4.22–5.81)
RDW: 14.6 % (ref 11.5–15.5)
WBC: 3.7 10*3/uL — ABNORMAL LOW (ref 4.0–10.5)

## 2014-05-04 LAB — TSH: TSH: 2.07 u[IU]/mL (ref 0.35–4.50)

## 2014-05-04 LAB — COMPREHENSIVE METABOLIC PANEL
ALBUMIN: 4.2 g/dL (ref 3.5–5.2)
ALT: 21 U/L (ref 0–53)
AST: 23 U/L (ref 0–37)
Alkaline Phosphatase: 28 U/L — ABNORMAL LOW (ref 39–117)
BILIRUBIN TOTAL: 0.8 mg/dL (ref 0.2–1.2)
BUN: 19 mg/dL (ref 6–23)
CO2: 32 meq/L (ref 19–32)
Calcium: 9.1 mg/dL (ref 8.4–10.5)
Chloride: 104 mEq/L (ref 96–112)
Creatinine, Ser: 0.89 mg/dL (ref 0.40–1.50)
GFR: 93.8 mL/min (ref >60.00)
GLUCOSE: 93 mg/dL (ref 70–99)
Potassium: 4.4 mEq/L (ref 3.5–5.1)
Sodium: 138 mEq/L (ref 135–145)
TOTAL PROTEIN: 6.2 g/dL (ref 6.0–8.3)

## 2014-05-04 LAB — LIPID PANEL
CHOLESTEROL: 165 mg/dL (ref 0–200)
HDL: 67.8 mg/dL (ref >39.00)
LDL Cholesterol: 90 mg/dL (ref 0–99)
NONHDL: 97.2
TRIGLYCERIDES: 38 mg/dL (ref 0.0–149.0)
Total CHOL/HDL Ratio: 2
VLDL: 7.6 mg/dL (ref 0.0–40.0)

## 2014-05-04 LAB — PSA: PSA: 2.17 ng/mL (ref 0.10–4.00)

## 2014-05-09 ENCOUNTER — Encounter: Payer: Self-pay | Admitting: Family Medicine

## 2014-05-09 ENCOUNTER — Ambulatory Visit (INDEPENDENT_AMBULATORY_CARE_PROVIDER_SITE_OTHER): Payer: BC Managed Care – PPO | Admitting: Family Medicine

## 2014-05-09 VITALS — BP 102/78 | HR 50 | Temp 98.1°F | Ht 68.0 in | Wt 146.8 lb

## 2014-05-09 DIAGNOSIS — Z0000 Encounter for general adult medical examination without abnormal findings: Secondary | ICD-10-CM | POA: Diagnosis not present

## 2014-05-09 DIAGNOSIS — N400 Enlarged prostate without lower urinary tract symptoms: Secondary | ICD-10-CM

## 2014-05-09 MED ORDER — FLUTICASONE-SALMETEROL 100-50 MCG/DOSE IN AEPB: 1.0000 | INHALATION_SPRAY | Freq: Two times a day (BID) | RESPIRATORY_TRACT | Status: DC

## 2014-05-09 NOTE — Progress Notes (Signed)
Subjective:    Patient ID: Matthew Snyder, male    DOB: 1956/01/14, 59 y.o.   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  Having trouble getting comfortable with it  Pretty tough -some pretty complex stuff  Will stick with the rehab floor for at least a year   Gets anxiety before he works-once he is there it is improved   Still running - and he really still enjoys it  Jogs 12 miles this am   Wt is stable - bmi of 22   Does not want hep C or HIV vaccine  Has had trouble with hep B vaccine -does not make antibodies to it  Has had needle sticks - and has had a lot of screening for that   Had a flu vaccine in the fall   Tdap 4/09   colonscopy 8/10  No polyps 10 year recall  More gas but no loose stools  Diet has not really changed     BPH- is about the same  He sometimes has to urinate twice to empty - not a change from recent  No prostate cancer in family Not bad enough to take medicine    Lab Results  Component Value Date   PSA 2.17 05/04/2014   PSA 1.98 10/02/2011   PSA 1.10 07/29/2009    Results for orders placed or performed in visit on 05/04/14  CBC with Differential/Platelet  Result Value Ref Range   WBC 3.7 (L) 4.0 - 10.5 K/uL   RBC 4.62 4.22 - 5.81 Mil/uL   Hemoglobin 13.6 13.0 - 17.0 g/dL   HCT 39.6 39.0 - 52.0 %   MCV 85.7 78.0 - 100.0 fl   MCHC 34.3 30.0 - 36.0 g/dL   RDW 14.6 11.5 - 15.5 %   Platelets 156.0 150.0 - 400.0 K/uL   Neutrophils Relative % 48.3 43.0 - 77.0 %   Lymphocytes Relative 39.1 12.0 - 46.0 %   Monocytes Relative 7.9 3.0 - 12.0 %   Eosinophils Relative 3.9 0.0 - 5.0 %   Basophils Relative 0.8 0.0 - 3.0 %   Neutro Abs 1.8 1.4 - 7.7 K/uL   Lymphs Abs 1.4 0.7 - 4.0 K/uL   Monocytes Absolute 0.3 0.1 - 1.0 K/uL   Eosinophils Absolute 0.1 0.0 - 0.7 K/uL   Basophils Absolute 0.0 0.0 - 0.1 K/uL    Comprehensive metabolic panel  Result Value Ref Range   Sodium 138 135 - 145 mEq/L   Potassium 4.4 3.5 - 5.1 mEq/L   Chloride 104 96 - 112 mEq/L   CO2 32 19 - 32 mEq/L   Glucose, Bld 93 70 - 99 mg/dL   BUN 19 6 - 23 mg/dL   Creatinine, Ser 0.89 0.40 - 1.50 mg/dL   Total Bilirubin 0.8 0.2 - 1.2 mg/dL   Alkaline Phosphatase 28 (L) 39 - 117 U/L   AST 23 0 - 37 U/L   ALT 21 0 - 53 U/L   Total Protein 6.2 6.0 - 8.3 g/dL   Albumin 4.2 3.5 - 5.2 g/dL   Calcium 9.1 8.4 - 10.5 mg/dL   GFR 93.80 >60.00 mL/min  Lipid panel  Result Value Ref Range   Cholesterol 165 0 - 200 mg/dL   Triglycerides 38.0 0.0 - 149.0 mg/dL   HDL 67.80 >39.00 mg/dL  VLDL 7.6 0.0 - 40.0 mg/dL   LDL Cholesterol 90 0 - 99 mg/dL   Total CHOL/HDL Ratio 2    NonHDL 97.20   PSA  Result Value Ref Range   PSA 2.17 0.10 - 4.00 ng/mL  TSH  Result Value Ref Range   TSH 2.07 0.35 - 4.50 uIU/mL     Lab Results  Component Value Date   CHOL 165 05/04/2014   CHOL 168 10/02/2011   CHOL 164 07/29/2009   Lab Results  Component Value Date   HDL 67.80 05/04/2014   HDL 73.00 10/02/2011   HDL 68.80 07/29/2009   Lab Results  Component Value Date   LDLCALC 90 05/04/2014   LDLCALC 88 10/02/2011   LDLCALC 90 07/29/2009   Lab Results  Component Value Date   TRIG 38.0 05/04/2014   TRIG 35.0 10/02/2011   TRIG 27.0 07/29/2009   Lab Results  Component Value Date   CHOLHDL 2 05/04/2014   CHOLHDL 2 10/02/2011   CHOLHDL 2 07/29/2009   No results found for: LDLDIRECT    Patient Active Problem List   Diagnosis Date Noted  . Routine general medical examination at a health care facility 09/27/2011  . BPH (benign prostatic hypertrophy) 09/27/2011  . Retinal detachment 05/30/2010  . Other general medical examination for administrative purposes 05/27/2010  . ASTHMA 07/31/2009  . UNSPECIFIED SUBJECTIVE VISUAL DISTURBANCE 02/23/2008  . ANEMIA 04/08/2007   Past Medical History  Diagnosis Date  . Allergic rhinitis    . Asthma   . Anemia     mild, from blood transfusion  . Retinal detachment    Past Surgical History  Procedure Laterality Date  . Retinal detachment surgery     History  Substance Use Topics  . Smoking status: Never Smoker   . Smokeless tobacco: Not on file  . Alcohol Use: Yes     Comment: rare   Family History  Problem Relation Age of Onset  . Aneurysm Sister     brain  . Tracheal cancer Brother     lung, smoker   No Known Allergies Current Outpatient Prescriptions on File Prior to Visit  Medication Sig Dispense Refill  . aspirin 81 MG tablet Take 81 mg by mouth daily.       No current facility-administered medications on file prior to visit.    Review of Systems Review of Systems  Constitutional: Negative for fever, appetite change, fatigue and unexpected weight change.  Eyes: Negative for pain and visual disturbance.  Respiratory: Negative for cough and shortness of breath.   Cardiovascular: Negative for cp or palpitations    Gastrointestinal: Negative for nausea, diarrhea and constipation.  Genitourinary: Negative for urgency and frequency.  Skin: Negative for pallor or rash   Neurological: Negative for weakness, light-headedness, numbness and headaches.  Hematological: Negative for adenopathy. Does not bruise/bleed easily.  Psychiatric/Behavioral: Negative for dysphoric mood. The patient is sometimes nervous/anxious.  pos for stressors        Objective:   Physical Exam  Constitutional: He appears well-developed and well-nourished. No distress.  Slim and well appearing   HENT:  Head: Normocephalic and atraumatic.  Right Ear: External ear normal.  Left Ear: External ear normal.  Nose: Nose normal.  Mouth/Throat: Oropharynx is clear and moist.  Eyes: Conjunctivae and EOM are normal. Pupils are equal, round, and reactive to light. Right eye exhibits no discharge. Left eye exhibits no discharge. No scleral icterus.  Neck: Normal range of motion. Neck supple.  No JVD present.  Carotid bruit is not present. No thyromegaly present.  Cardiovascular: Normal rate, regular rhythm, normal heart sounds and intact distal pulses.  Exam reveals no gallop.   Pulmonary/Chest: Effort normal and breath sounds normal. No respiratory distress. He has no wheezes. He exhibits no tenderness.  Abdominal: Soft. Bowel sounds are normal. He exhibits no distension, no abdominal bruit and no mass. There is no tenderness.  Genitourinary: Rectum normal. Rectal exam shows no mass, no tenderness and anal tone normal. Prostate is enlarged.  Symmetrically enlarged prostate-firm and nontender   Musculoskeletal: He exhibits no edema or tenderness.  Lymphadenopathy:    He has no cervical adenopathy.  Neurological: He is alert. He has normal reflexes. No cranial nerve deficit. He exhibits normal muscle tone. Coordination normal.  Skin: Skin is warm and dry. No rash noted. No erythema. No pallor.  Psychiatric: He has a normal mood and affect.          Assessment & Plan:   Problem List Items Addressed This Visit      Genitourinary   BPH (benign prostatic hypertrophy)    No change in exam or symptoms  Lab Results  Component Value Date   PSA 2.17 05/04/2014   PSA 1.98 10/02/2011   PSA 1.10 07/29/2009   Fairly stable  Pt does not desire medication  Will continue to follow         Other   Routine general medical examination at a health care facility - Primary    Reviewed health habits including diet and exercise and skin cancer prevention Reviewed appropriate screening tests for age  Also reviewed health mt list, fam hx and immunization status , as well as social and family history   See HPI Labs reviewed  Enc good health habits

## 2014-05-09 NOTE — Progress Notes (Signed)
Pre visit review using our clinic review tool, if applicable. No additional management support is needed unless otherwise documented below in the visit note. 

## 2014-05-09 NOTE — Patient Instructions (Addendum)
Labs look good Keep exercising and taking care of yourself  Try to eat a balanced diet and don't forget to eat  Investigate options for counseling at work also

## 2014-05-10 NOTE — Assessment & Plan Note (Signed)
No change in exam or symptoms  Lab Results  Component Value Date   PSA 2.17 05/04/2014   PSA 1.98 10/02/2011   PSA 1.10 07/29/2009   Fairly stable  Pt does not desire medication  Will continue to follow

## 2014-05-10 NOTE — Assessment & Plan Note (Signed)
Reviewed health habits including diet and exercise and skin cancer prevention Reviewed appropriate screening tests for age  Also reviewed health mt list, fam hx and immunization status , as well as social and family history   See HPI Labs reviewed  Enc good health habits

## 2014-09-10 ENCOUNTER — Ambulatory Visit (INDEPENDENT_AMBULATORY_CARE_PROVIDER_SITE_OTHER): Payer: BC Managed Care – PPO | Admitting: Family Medicine

## 2014-09-10 ENCOUNTER — Encounter: Payer: Self-pay | Admitting: Family Medicine

## 2014-09-10 VITALS — BP 112/62 | HR 67 | Temp 98.3°F | Ht 68.0 in | Wt 146.0 lb

## 2014-09-10 DIAGNOSIS — L609 Nail disorder, unspecified: Secondary | ICD-10-CM | POA: Diagnosis not present

## 2014-09-10 DIAGNOSIS — Q846 Other congenital malformations of nails: Secondary | ICD-10-CM | POA: Insufficient documentation

## 2014-09-10 NOTE — Progress Notes (Signed)
Subjective:    Patient ID: Matthew Snyder, male    DOB: 04/02/1955, 59 y.o.   MRN: [id number]  HPI Issue with toe for 2 months   L 5th toe  Not painful Nail is thick and curling and slightly yellow  Is getting harder to cut   Still runs (not as much as he was)   Has changed shoes- a little tighter   Other toenails look fine   No foot fungus symptoms - no itching or peeling Does not get pedicures   Patient Active Problem List   Diagnosis Date Noted  . Routine general medical examination at a health care facility 09/27/2011  . BPH (benign prostatic hypertrophy) 09/27/2011  . Retinal detachment 05/30/2010  . Other general medical examination for administrative purposes 05/27/2010  . ASTHMA 07/31/2009  . UNSPECIFIED SUBJECTIVE VISUAL DISTURBANCE 02/23/2008  . ANEMIA 04/08/2007   Past Medical History  Diagnosis Date  . Allergic rhinitis   . Asthma   . Anemia     mild, from blood transfusion  . Retinal detachment    Past Surgical History  Procedure Laterality Date  . Retinal detachment surgery     History  Substance Use Topics  . Smoking status: Never Smoker   . Smokeless tobacco: Not on file  . Alcohol Use: Yes     Comment: rare   Family History  Problem Relation Age of Onset  . Aneurysm Sister     brain  . Tracheal cancer Brother     lung, smoker   No Known Allergies Current Outpatient Prescriptions on File Prior to Visit  Medication Sig Dispense Refill  . aspirin 81 MG tablet Take 81 mg by mouth daily.      . Fluticasone-Salmeterol (ADVAIR DISKUS) 100-50 MCG/DOSE AEPB Inhale 1 puff into the lungs 2 (two) times daily. 3 each 3   No current facility-administered medications on file prior to visit.      Review of Systems Review of Systems  Constitutional: Negative for fever, appetite change, fatigue and unexpected weight change.  Eyes: Negative for pain and visual disturbance.  Respiratory: Negative for cough and shortness of breath.     Cardiovascular: Negative for cp or palpitations    Gastrointestinal: Negative for nausea, diarrhea and constipation.  Genitourinary: Negative for urgency and frequency.  Skin: Negative for pallor or rash  neg for discomfort of any nails  Neurological: Negative for weakness, light-headedness, numbness and headaches.  Hematological: Negative for adenopathy. Does not bruise/bleed easily.  Psychiatric/Behavioral: Negative for dysphoric mood. The patient is not nervous/anxious.         Objective:   Physical Exam  Constitutional: He appears well-developed and well-nourished. No distress.  Eyes: Conjunctivae and EOM are normal. Pupils are equal, round, and reactive to light.  Cardiovascular: Normal rate and regular rhythm.   Neurological: He is alert.  Skin: Skin is warm and dry. No rash noted. No erythema. No pallor.  L 5th toenail is thickened and very slt yellowed  Scraping taken for KOH slide  Nail trimmed straight across-not enough clipping to send for culture   Psychiatric: He has a normal mood and affect.          Assessment & Plan:   Problem List Items Addressed This Visit    Dysplastic toenail - Primary    Dystrophic L 5th nail - which is thickened (and very slt yellowed) Suspect this is more likely due to trauma of running than a fungal infx Trimmed nail Did look at  KOH prep under the microscope and not signs of dermatophyte infx seen  Not enough nail for culture Will try wider shoes  If this does not improve or becomes painful-consider tx with lamasil

## 2014-09-10 NOTE — Progress Notes (Signed)
Pre visit review using our clinic review tool, if applicable. No additional management support is needed unless otherwise documented below in the visit note. 

## 2014-09-10 NOTE — Assessment & Plan Note (Signed)
Dystrophic L 5th nail - which is thickened (and very slt yellowed) Suspect this is more likely due to trauma of running than a fungal infx Trimmed nail Did look at [facility] prep under the microscope and not signs of dermatophyte infx seen  Not enough nail for culture Will try wider shoes  If this does not improve or becomes painful-consider tx with lamasil

## 2014-09-10 NOTE — Patient Instructions (Signed)
If toenail becomes thicker or painful please update me Try some wider running shoes and see how it goes  If any signs/symptoms of athlete's foot- treat immediately

## 2014-11-19 ENCOUNTER — Telehealth: Payer: Self-pay | Admitting: Family Medicine

## 2014-11-19 DIAGNOSIS — Z7689 Persons encountering health services in other specified circumstances: Secondary | ICD-10-CM

## 2014-11-19 NOTE — Telephone Encounter (Signed)
Form in your inbox, I attached a copy of the recent labs because the form said that it needs to be included

## 2014-11-19 NOTE — Telephone Encounter (Signed)
Pt dropped off Health Screening form to be completed. Form on Sh[place]'s desk. Thank you.

## 2014-11-19 NOTE — Telephone Encounter (Signed)
Done and in IN box  He will need to put his waist circumference in

## 2014-11-20 NOTE — Telephone Encounter (Signed)
Pt gave me his waist circ., form faxed and original mailed to pt, pt aware and advise of fee

## 2015-05-06 ENCOUNTER — Other Ambulatory Visit (INDEPENDENT_AMBULATORY_CARE_PROVIDER_SITE_OTHER): Payer: 59

## 2015-05-06 ENCOUNTER — Telehealth: Payer: Self-pay | Admitting: Family Medicine

## 2015-05-06 DIAGNOSIS — Z0000 Encounter for general adult medical examination without abnormal findings: Secondary | ICD-10-CM

## 2015-05-06 DIAGNOSIS — N400 Enlarged prostate without lower urinary tract symptoms: Secondary | ICD-10-CM

## 2015-05-06 LAB — PSA: PSA: 3.73 ng/mL (ref 0.10–4.00)

## 2015-05-06 LAB — CBC WITH DIFFERENTIAL/PLATELET
BASOS ABS: 0 10*3/uL (ref 0.0–0.1)
Basophils Relative: 0.7 % (ref 0.0–3.0)
EOS ABS: 0.1 10*3/uL (ref 0.0–0.7)
Eosinophils Relative: 4 % (ref 0.0–5.0)
HEMATOCRIT: 41.5 % (ref 39.0–52.0)
HEMOGLOBIN: 14.1 g/dL (ref 13.0–17.0)
Lymphocytes Relative: 31.5 % (ref 12.0–46.0)
Lymphs Abs: 1.1 10*3/uL (ref 0.7–4.0)
MCHC: 34 g/dL (ref 30.0–36.0)
MCV: 86.3 fl (ref 78.0–100.0)
MONOS PCT: 8.3 % (ref 3.0–12.0)
Monocytes Absolute: 0.3 10*3/uL (ref 0.1–1.0)
Neutro Abs: 1.9 10*3/uL (ref 1.4–7.7)
Neutrophils Relative %: 55.5 % (ref 43.0–77.0)
Platelets: 155 10*3/uL (ref 150.0–400.0)
RBC: 4.82 Mil/uL (ref 4.22–5.81)
RDW: 14.5 % (ref 11.5–15.5)
WBC: 3.4 10*3/uL — AB (ref 4.0–10.5)

## 2015-05-06 LAB — COMPREHENSIVE METABOLIC PANEL
ALBUMIN: 4.3 g/dL (ref 3.5–5.2)
ALK PHOS: 30 U/L — AB (ref 39–117)
ALT: 20 U/L (ref 0–53)
AST: 27 U/L (ref 0–37)
BILIRUBIN TOTAL: 0.6 mg/dL (ref 0.2–1.2)
BUN: 19 mg/dL (ref 6–23)
CALCIUM: 9.2 mg/dL (ref 8.4–10.5)
CO2: 31 mEq/L (ref 19–32)
CREATININE: 0.87 mg/dL (ref 0.40–1.50)
Chloride: 104 mEq/L (ref 96–112)
GFR: 95.95 mL/min (ref >60.00)
Glucose, Bld: 112 mg/dL — ABNORMAL HIGH (ref 70–99)
Potassium: 4.3 mEq/L (ref 3.5–5.1)
Sodium: 141 mEq/L (ref 135–145)
Total Protein: 6.2 g/dL (ref 6.0–8.3)

## 2015-05-06 LAB — LIPID PANEL
CHOLESTEROL: 168 mg/dL (ref 0–200)
HDL: 69.7 mg/dL (ref >39.00)
LDL Cholesterol: 91 mg/dL (ref 0–99)
NonHDL: 98.47
TRIGLYCERIDES: 37 mg/dL (ref 0.0–149.0)
Total CHOL/HDL Ratio: 2
VLDL: 7.4 mg/dL (ref 0.0–40.0)

## 2015-05-06 LAB — TSH: TSH: 1.4 u[IU]/mL (ref 0.35–4.50)

## 2015-05-06 NOTE — Telephone Encounter (Signed)
cpx labs  

## 2015-05-10 ENCOUNTER — Encounter: Payer: Self-pay | Admitting: Family Medicine

## 2015-05-10 ENCOUNTER — Ambulatory Visit (INDEPENDENT_AMBULATORY_CARE_PROVIDER_SITE_OTHER): Payer: 59 | Admitting: Family Medicine

## 2015-05-10 VITALS — BP 106/60 | HR 56 | Temp 98.3°F | Ht 68.25 in | Wt 147.8 lb

## 2015-05-10 DIAGNOSIS — R972 Elevated prostate specific antigen [PSA]: Secondary | ICD-10-CM | POA: Insufficient documentation

## 2015-05-10 DIAGNOSIS — N400 Enlarged prostate without lower urinary tract symptoms: Secondary | ICD-10-CM

## 2015-05-10 DIAGNOSIS — R739 Hyperglycemia, unspecified: Secondary | ICD-10-CM | POA: Diagnosis not present

## 2015-05-10 DIAGNOSIS — Z0000 Encounter for general adult medical examination without abnormal findings: Secondary | ICD-10-CM

## 2015-05-10 DIAGNOSIS — R7303 Prediabetes: Secondary | ICD-10-CM | POA: Insufficient documentation

## 2015-05-10 LAB — HEMOGLOBIN A1C: Hgb A1c MFr Bld: 6.1 % (ref 4.6–6.5)

## 2015-05-10 NOTE — Progress Notes (Signed)
Subjective:    Patient ID: Matthew Snyder, male    DOB: December 27, 1955, 60 y.o.   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 [place]  Is 30 hours per week now  Stress level is better   Wt is up 1 lb with bmi of 68  Was still running until he started this job - will start back soon -looks forward to taking care of himself   Hep C/HIV screen - not high risk (used to donate blood and was screened then as well) Declines screening   Flu shot 9/16   Td 4/09  Colonoscopy 8/10 - 10 year recall   Prostate screen  Hx of BPH Lab Results  Component Value Date   PSA 3.73 05/06/2015   PSA 2.17 05/04/2014   PSA 1.98 10/02/2011   no change in symptoms  No nocturia  Sometimes has to void twice to empty bladder - waits and then finishes -the past several years     Baseline low wbc Lab Results  Component Value Date   WBC 3.4* 05/06/2015   HGB 14.1 05/06/2015   HCT 41.5 05/06/2015   MCV 86.3 05/06/2015   PLT 155.0 05/06/2015     Glucose was 112  Was fasting for breakfast- ? What he ate the night before Not a big sweet eater  Eats a lot of peanuts  Does like biscuits  Would like to do A1C today  Brother has DM 2 - even though he is not overweight       Chemistry      Component Value Date/Time   NA 141 05/06/2015 0941   K 4.3 05/06/2015 0941   CL 104 05/06/2015 0941   CO2 31 05/06/2015 0941   BUN 19 05/06/2015 0941   CREATININE 0.87 05/06/2015 0941      Component Value Date/Time   CALCIUM 9.2 05/06/2015 0941   ALKPHOS 30* 05/06/2015 0941   AST 27 05/06/2015 0941   ALT 20 05/06/2015 0941   BILITOT 0.6 05/06/2015 0941     Lab Results  Component Value Date   TSH 1.40 05/06/2015    Cholesterol Lab Results  Component Value Date   CHOL 168 05/06/2015   CHOL 165 05/04/2014   CHOL 168 10/02/2011   Lab Results  Component Value Date   HDL 69.70  05/06/2015   HDL 67.80 05/04/2014   HDL 73.00 10/02/2011   Lab Results  Component Value Date   LDLCALC 91 05/06/2015   LDLCALC 90 05/04/2014   LDLCALC 88 10/02/2011   Lab Results  Component Value Date   TRIG 37.0 05/06/2015   TRIG 38.0 05/04/2014   TRIG 35.0 10/02/2011   Lab Results  Component Value Date   CHOLHDL 2 05/06/2015   CHOLHDL 2 05/04/2014   CHOLHDL 2 10/02/2011   No results found for: LDLDIRECT   Review of Systems Review of Systems  Constitutional: Negative for fever, appetite change, fatigue and unexpected weight change.  Eyes: Negative for pain and visual disturbance.  Respiratory: Negative for cough and shortness of breath.   Cardiovascular: Negative for cp or palpitations    Gastrointestinal: Negative for nausea, diarrhea and constipation.  Genitourinary: pos  for urgency and frequency. neg for dysuria  Skin: Negative for pallor or rash   Neurological: Negative for weakness, light-headedness, numbness and headaches.  Hematological: Negative for adenopathy. Does not bruise/bleed easily.  Psychiatric/Behavioral: Negative for dysphoric mood. The patient is not nervous/anxious.        Objective:   Physical Exam  Constitutional: He appears well-developed and well-nourished. No distress.  Well appearing   HENT:  Head: Normocephalic and atraumatic.  Right Ear: External ear normal.  Left Ear: External ear normal.  Nose: Nose normal.  Mouth/Throat: Oropharynx is clear and moist.  Eyes: Conjunctivae and EOM are normal. Pupils are equal, round, and reactive to light. Right eye exhibits no discharge. Left eye exhibits no discharge. No scleral icterus.  Neck: Normal range of motion. Neck supple. No JVD present. Carotid bruit is not present. No thyromegaly present.  Cardiovascular: Normal rate, regular rhythm, normal heart sounds and intact distal pulses.  Exam reveals no gallop.   Pulmonary/Chest: Effort normal and breath sounds normal. No respiratory distress.  He has no wheezes. He exhibits no tenderness.  Abdominal: Soft. Bowel sounds are normal. He exhibits no distension, no abdominal bruit and no mass. There is no tenderness.  Musculoskeletal: He exhibits no edema or tenderness.  Lymphadenopathy:    He has no cervical adenopathy.  Neurological: He is alert. He has normal reflexes. No cranial nerve deficit. He exhibits normal muscle tone. Coordination normal.  Skin: Skin is warm and dry. No rash noted. No erythema. No pallor.  Baseline brown nevi on back   Psychiatric: He has a normal mood and affect.          Assessment & Plan:   Problem List Items Addressed This Visit      Genitourinary   BPH (benign prostatic hypertrophy)    psa has risen over a point -but no change in symptoms Ref to urology for further eval and tx       Relevant Orders   Ambulatory referral to Urology     Other   Elevated PSA    Up over a point in the past year  Does have BPH Ref to urology for further eval       Relevant Orders   Ambulatory referral to Urology   Hyperglycemia    A1C today  Diet is good overall  Plans on getting back to running/exercise       Relevant Orders   Hemoglobin A1c (Completed)   Routine general medical examination at a health care facility - Primary    Reviewed health habits including diet and exercise and skin cancer prevention Reviewed appropriate screening tests for age  Also reviewed health mt list, fam hx and immunization status , as well as social and family history   See HPI Labs reviewed Ref to urol for psa and BPH A1C for elevated glucose Plans to get back to running

## 2015-05-10 NOTE — Patient Instructions (Signed)
A1C today  Don't overdo the sugar  Get back to exercise when you can  Stop at check out for urology referral   Take care of yourself

## 2015-05-10 NOTE — Progress Notes (Signed)
Pre visit review using our clinic review tool, if applicable. No additional management support is needed unless otherwise documented below in the visit note. 

## 2015-05-12 NOTE — Assessment & Plan Note (Signed)
A1C today  Diet is good overall  Plans on getting back to running/exercise

## 2015-05-12 NOTE — Assessment & Plan Note (Signed)
psa has risen over a point -but no change in symptoms Ref to urology for further eval and tx

## 2015-05-12 NOTE — Assessment & Plan Note (Signed)
Up over a point in the past year  Does have BPH Ref to urology for further eval

## 2015-05-12 NOTE — Assessment & Plan Note (Signed)
Reviewed health habits including diet and exercise and skin cancer prevention Reviewed appropriate screening tests for age  Also reviewed health mt list, fam hx and immunization status , as well as social and family history   See HPI Labs reviewed Ref to urol for psa and BPH A1C for elevated glucose Plans to get back to running

## 2015-05-13 ENCOUNTER — Encounter: Payer: Self-pay | Admitting: *Deleted

## 2015-05-28 ENCOUNTER — Encounter: Payer: Self-pay | Admitting: Urology

## 2015-05-28 ENCOUNTER — Ambulatory Visit (INDEPENDENT_AMBULATORY_CARE_PROVIDER_SITE_OTHER): Payer: 59 | Admitting: Urology

## 2015-05-28 VITALS — BP 110/68 | HR 57 | Ht 68.0 in | Wt 148.6 lb

## 2015-05-28 DIAGNOSIS — R3915 Urgency of urination: Secondary | ICD-10-CM | POA: Diagnosis not present

## 2015-05-28 DIAGNOSIS — N400 Enlarged prostate without lower urinary tract symptoms: Secondary | ICD-10-CM | POA: Diagnosis not present

## 2015-05-28 DIAGNOSIS — R972 Elevated prostate specific antigen [PSA]: Secondary | ICD-10-CM

## 2015-05-28 LAB — URINALYSIS, COMPLETE
BILIRUBIN UA: NEGATIVE
GLUCOSE, UA: NEGATIVE
Ketones, UA: NEGATIVE
Leukocytes, UA: NEGATIVE
NITRITE UA: NEGATIVE
PH UA: 5.5 (ref 5.0–7.5)
Protein, UA: NEGATIVE
RBC UA: NEGATIVE
Specific Gravity, UA: 1.025 (ref 1.005–1.030)
UUROB: 0.2 mg/dL (ref 0.2–1.0)

## 2015-05-28 LAB — MICROSCOPIC EXAMINATION
BACTERIA UA: NONE SEEN
Epithelial Cells (non renal): NONE SEEN /hpf (ref 0–10)
WBC UA: NONE SEEN /HPF (ref 0–?)

## 2015-05-28 LAB — BLADDER SCAN AMB NON-IMAGING: SCAN RESULT: 40

## 2015-05-28 MED ORDER — SULFAMETHOXAZOLE-TRIMETHOPRIM 800-160 MG PO TABS: 1.0000 | ORAL_TABLET | Freq: Two times a day (BID) | ORAL | Status: DC

## 2015-05-28 NOTE — Progress Notes (Signed)
05/28/2015 10:15 AM   [name] 1955/10/24 [id number]  Referring provider: Abner Greenspan, MD [address] [place], [place] [address]  Chief Complaint  Patient presents with  . New Patient (Initial Visit)    risen PSA, BPH    HPI:  1 - Rising PSA - NoFHX prostate cancer. PSA with year over year rise astutely noted by PCP 2017. 2013 PSA <2 2016 PSA 2.17 2017 PSA 3.7 / DRE 80gm assymetric, no frank nodules  2 - Lower Urinary Tract Symptoms  - slowly progressive bother from mostly irritative voiding symptoms with urgency, double void. DRE 2017 80gm. PVR 2017 "34mL".  PMH sig for TNA, Lt eye surgery. No CV disease. No blood thinners. He is runs for fitness and is an Therapist, sports at assisted living facility in [place]  His PCP is Jacqulynn Cadet MD with [facility].  Today  " Matthew [place] " is seen in consultation for above. He is referred by Dr. Alba Cory.   PMH: Past Medical History  Diagnosis Date  . Allergic rhinitis   . Asthma   . Anemia     mild, from blood transfusion  . Retinal detachment     Surgical History: Past Surgical History  Procedure Laterality Date  . Retinal detachment surgery    . Cataract extraction  2010    left eye    Home Medications:    Medication List       This list is accurate as of: 05/28/15 10:15 AM.  Always use your most recent med list.               aspirin 81 MG tablet  Take 81 mg by mouth daily.     Fluticasone-Salmeterol 100-50 MCG/DOSE Aepb  Commonly known as:  ADVAIR DISKUS  Inhale 1 puff into the lungs 2 (two) times daily.        Allergies: No Known Allergies  Family History: Family History  Problem Relation Age of Onset  . Aneurysm Sister     brain  . Tracheal cancer Brother     lung, smoker  . Prostate cancer Neg Hx   . Renal cancer Neg Hx     Social History:  reports that he has never smoked. He does not have any smokeless tobacco history on file. He reports that he drinks alcohol. He  reports that he does not use illicit drugs.  ROS:   Urological Symptom Review  Patient is experiencing the following symptoms: Frequent urination   Review of Systems  Gastrointestinal (upper)  : Negative for upper GI symptoms  Gastrointestinal (lower) : Negative for lower GI symptoms  Constitutional : Negative for symptoms  Skin: Negative for skin symptoms  Eyes: Negative for eye symptoms  Ear/Nose/Throat : Negative for Ear/Nose/Throat symptoms  Hematologic/Lymphatic: Negative for Hematologic/Lymphatic symptoms  Cardiovascular : Negative for cardiovascular symptoms  Respiratory : Negative for respiratory symptoms  Endocrine: Negative for endocrine symptoms  Musculoskeletal: Negative for musculoskeletal symptoms  Neurological: Negative for neurological symptoms  Psychologic: Negative for psychiatric symptoms        Physical Exam: BP 110/68 mmHg  Pulse 57  Ht 5\' 8"  (1.727 m)  Wt 148 lb 9.6 oz (67.405 kg)  BMI 22.60 kg/m2  Constitutional:  Alert and oriented, No acute distress. HEENT: [place] AT, moist mucus membranes.  Trachea midline, no masses. Cardiovascular: No clubbing, cyanosis, or edema. Respiratory: Normal respiratory effort, no increased work of breathing. GI: Abdomen is soft, nontender, nondistended, no abdominal masses  GU: No CVA tenderness. Penis ircumscised, straight. Testes down w/o masses. DRE 80gm smooth with some assymtry but no nodules. Skin: No rashes, bruises or suspicious lesions. Lymph: No cervical or inguinal adenopathy. Neurologic: Grossly intact, no focal deficits, moving all 4 extremities. Psychiatric: Normal mood and affect.  Laboratory Data: Lab Results  Component Value Date   WBC 3.4* 05/06/2015   HGB 14.1 05/06/2015   HCT 41.5 05/06/2015   MCV 86.3 05/06/2015   PLT 155.0 05/06/2015    Lab Results  Component Value Date   CREATININE 0.87 05/06/2015    Lab Results  Component Value Date   PSA 3.73 05/06/2015    PSA 2.17 05/04/2014   PSA 1.98 10/02/2011    No results found for: TESTOSTERONE  Lab Results  Component Value Date   HGBA1C 6.1 05/10/2015    Urinalysis    Component Value Date/Time   BILIRUBINUR neg 05/27/2010 1535   PROTEINUR neg 05/27/2010 1535   UROBILINOGEN 0.2 05/27/2010 1535   NITRITE neg 05/27/2010 1535   LEUKOCYTESUR neg 05/27/2010 1535     Assessment & Plan:    1 - Rising PSA - year over year rise in man with minimal comorbidity concerning. His gland is quite large as well. Discussed DDX malignant and benign etiologies as well as natural history of prostate cancer. Discussed options of surveillance / recheck v. Biopsy and he opts for biopsy. I agree. Peri-BX course discussed in detail as well as risks, benefits, altenratives.   Bactrim RX'd today  2 - Lower Urinary Tract Symptoms  - likely BPH by exam, if BX negative, would strongly consider daily finasteride to help current symptoms and prevent progression.   3 - RTC for prostate biopsy   No Follow-up on file.  Alexis Frock, [facility] [address] [place], [place] [address] [phone]

## 2015-05-28 NOTE — Progress Notes (Signed)
Bladder Scan Patient void: 20ml Performed By: Larna Daughters

## 2015-06-19 ENCOUNTER — Other Ambulatory Visit: Payer: Self-pay | Admitting: Urology

## 2015-06-20 ENCOUNTER — Encounter: Payer: Self-pay | Admitting: Urology

## 2015-06-20 ENCOUNTER — Ambulatory Visit (INDEPENDENT_AMBULATORY_CARE_PROVIDER_SITE_OTHER): Payer: 59 | Admitting: Urology

## 2015-06-20 VITALS — BP 124/72 | HR 56 | Ht 68.0 in | Wt 144.0 lb

## 2015-06-20 DIAGNOSIS — R972 Elevated prostate specific antigen [PSA]: Secondary | ICD-10-CM

## 2015-06-20 DIAGNOSIS — N400 Enlarged prostate without lower urinary tract symptoms: Secondary | ICD-10-CM

## 2015-06-20 MED ORDER — LIDOCAINE HCL 2 % EX GEL
1.0000 "application " | Freq: Once | CUTANEOUS | Status: AC
  Administered 2015-06-20: 1 via URETHRAL

## 2015-06-20 MED ORDER — GENTAMICIN SULFATE 40 MG/ML IJ SOLN
80.0000 mg | Freq: Once | INTRAMUSCULAR | Status: AC
  Administered 2015-06-20: 80 mg via INTRAMUSCULAR

## 2015-06-20 NOTE — Progress Notes (Signed)
Prostate Biopsy Procedure   Informed consent was obtained after discussing risks/benefits of the procedure.  A time out was performed to ensure correct patient identity.  Pre-Procedure: - Last PSA Level:  Lab Results  Component Value Date   PSA 3.73 05/06/2015   PSA 2.17 05/04/2014   PSA 1.98 10/02/2011   - Gentamicin given prophylactically - Levaquin 500 mg administered PO -Transrectal Ultrasound performed revealing a 43 gm prostate -No significant hypoechoic or median lobe noted  Procedure: - Prostate block performed using 10 cc 1% lidocaine and biopsies taken from sextant areas, a total of 12 under ultrasound guidance.  Post-Procedure: - Patient tolerated the procedure well - He was counseled to seek immediate medical attention if experiences any severe pain, significant bleeding, or fevers - Return in one week to discuss biopsy results

## 2015-06-27 LAB — PATHOLOGY REPORT

## 2015-06-28 ENCOUNTER — Encounter: Payer: Self-pay | Admitting: Urology

## 2015-06-28 ENCOUNTER — Ambulatory Visit (INDEPENDENT_AMBULATORY_CARE_PROVIDER_SITE_OTHER): Payer: 59 | Admitting: Urology

## 2015-06-28 VITALS — BP 121/81 | HR 52 | Ht 68.0 in | Wt 144.6 lb

## 2015-06-28 DIAGNOSIS — C61 Malignant neoplasm of prostate: Secondary | ICD-10-CM

## 2015-06-28 NOTE — Progress Notes (Signed)
06/28/2015 4:36 PM   [name] [birth year] [id number]  Referring provider: Abner Greenspan, MD [address] [place], [place] [address]  Chief Complaint  Patient presents with  . Follow-up    Prostate biopsy results    HPI: The patient is a 60 year old gentleman who recently underwent a prostate biopsy for a rising PSA. His prostate biopsy results showed 1 core of Gleason 3+3 = 6 prostate cancer. Only 5% of this core was involved. It was the core from the right lateral apex. He also had high-grade pin in the right lateral mid.  He has done well since prostate biopsy. He had some hematuria that is since resolved. No other problems.   PMH: Past Medical History  Diagnosis Date  . Allergic rhinitis   . Asthma   . Anemia     mild, from blood transfusion  . Retinal detachment     Surgical History: Past Surgical History  Procedure Laterality Date  . Retinal detachment surgery    . Cataract extraction  2010    left eye    Home Medications:    Medication List       This list is accurate as of: 06/28/15  4:36 PM.  Always use your most recent med list.               aspirin 81 MG tablet  Take 81 mg by mouth daily. Reported on 06/28/2015     Fluticasone-Salmeterol 100-50 MCG/DOSE Aepb  Commonly known as:  ADVAIR DISKUS  Inhale 1 puff into the lungs 2 (two) times daily.        Allergies: No Known Allergies  Family History: Family History  Problem Relation Age of Onset  . Aneurysm Sister     brain  . Tracheal cancer Brother     lung, smoker  . Prostate cancer Neg Hx   . Renal cancer Neg Hx     Social History:  reports that he has never smoked. He does not have any smokeless tobacco history on file. He reports that he drinks alcohol. He reports that he does not use illicit drugs.  ROS:                                        Physical Exam: BP 121/81 mmHg  Pulse 52  Ht 5\' 8"  (1.727 m)  Wt 144 lb  9.6 oz (65.59 kg)  BMI 21.99 kg/m2  Constitutional:  Alert and oriented, No acute distress. HEENT: [place] AT, moist mucus membranes.  Trachea midline, no masses. Cardiovascular: No clubbing, cyanosis, or edema. Respiratory: Normal respiratory effort, no increased work of breathing. GI: Abdomen is soft, nontender, nondistended, no abdominal masses GU: No CVA tenderness.  Skin: No rashes, bruises or suspicious lesions. Lymph: No cervical or inguinal adenopathy. Neurologic: Grossly intact, no focal deficits, moving all 4 extremities. Psychiatric: Normal mood and affect.  Laboratory Data: Lab Results  Component Value Date   WBC 3.4* 05/06/2015   HGB 14.1 05/06/2015   HCT 41.5 05/06/2015   MCV 86.3 05/06/2015   PLT 155.0 05/06/2015    Lab Results  Component Value Date   CREATININE 0.87 05/06/2015    Lab Results  Component Value Date   PSA 3.73 05/06/2015   PSA 2.17 05/04/2014   PSA 1.98 10/02/2011    No results found for: TESTOSTERONE  Lab Results  Component  Value Date   HGBA1C 6.1 05/10/2015    Urinalysis    Component Value Date/Time   APPEARANCEUR Clear 05/28/2015 1017   GLUCOSEU Negative 05/28/2015 1017   BILIRUBINUR Negative 05/28/2015 1017   BILIRUBINUR neg 05/27/2010 1535   PROTEINUR Negative 05/28/2015 1017   PROTEINUR neg 05/27/2010 1535   UROBILINOGEN 0.2 05/27/2010 1535   NITRITE Negative 05/28/2015 1017   NITRITE neg 05/27/2010 1535   LEUKOCYTESUR Negative 05/28/2015 1017   LEUKOCYTESUR neg 05/27/2010 1535     Assessment & Plan:    I had a long discussion with the patient regarding his new diagnosis of very low risk prostate cancer. We discussed the natural history of prostate cancer in particular in regards to very low risk prostate cancer. We discussed in very great detail watchful waiting, active surveillance, prostatectomy, radiation therapy, and cryotherapy. Specifically we spent a lot of time talking about active surveillance. He is the ideal  candidate for this process. He does understand that we'll take repeat prostate biopsies as well as fairly regular PSA and digital rectal exams. He understands the goal of this process is to treat prostate cancer if and when he progresses with the goal to cure. We discussed other risks, benefits, and indications for this process. He has chosen to undergo active surveillance.  1. Very low risk prostate cancer -repeat prostate biopsy in 3 months  Return in about 3 months (around 09/28/2015) for prostate biopsy.  Nickie Retort, MD  [facility] [address] [place],  [address] [phone]

## 2015-07-04 ENCOUNTER — Ambulatory Visit: Payer: 59

## 2015-07-04 ENCOUNTER — Other Ambulatory Visit: Payer: Self-pay | Admitting: Urology

## 2015-09-27 ENCOUNTER — Ambulatory Visit (INDEPENDENT_AMBULATORY_CARE_PROVIDER_SITE_OTHER): Payer: 59 | Admitting: Internal Medicine

## 2015-09-27 ENCOUNTER — Other Ambulatory Visit: Payer: Self-pay | Admitting: Urology

## 2015-09-27 ENCOUNTER — Ambulatory Visit (INDEPENDENT_AMBULATORY_CARE_PROVIDER_SITE_OTHER): Payer: 59 | Admitting: Urology

## 2015-09-27 ENCOUNTER — Encounter: Payer: Self-pay | Admitting: Urology

## 2015-09-27 ENCOUNTER — Encounter: Payer: Self-pay | Admitting: Internal Medicine

## 2015-09-27 VITALS — BP 104/60 | HR 49 | Ht 68.0 in | Wt 141.0 lb

## 2015-09-27 VITALS — BP 118/62 | HR 60 | Ht 68.0 in | Wt 142.0 lb

## 2015-09-27 DIAGNOSIS — C61 Malignant neoplasm of prostate: Secondary | ICD-10-CM

## 2015-09-27 DIAGNOSIS — G4719 Other hypersomnia: Secondary | ICD-10-CM

## 2015-09-27 DIAGNOSIS — R0683 Snoring: Secondary | ICD-10-CM

## 2015-09-27 MED ORDER — GENTAMICIN SULFATE 40 MG/ML IJ SOLN
80.0000 mg | Freq: Once | INTRAMUSCULAR | Status: AC
  Administered 2015-09-27: 80 mg via INTRAMUSCULAR

## 2015-09-27 MED ORDER — LEVOFLOXACIN 500 MG PO TABS
500.0000 mg | ORAL_TABLET | Freq: Once | ORAL | Status: AC
  Administered 2015-09-27: 500 mg via ORAL

## 2015-09-27 NOTE — Progress Notes (Signed)
Matthew Snyder      Date: 09/27/2015,   MRN# [id number] Matthew Snyder 01-16-1956 Code Status:  Code Status History    This patient does not have a recorded code status. Please follow your organizational policy for patients in this situation.     Hosp day:@LENGTHOFSTAYDAYS @ Referring MD: @ATDPROV @     PCP:      AdmissionWeight: 142 lb (64.4 kg)                 CurrentWeight: 142 lb (64.4 kg) Matthew Snyder is a 60 y.o. old male seen in [place] for sleep problems at the request of patient.     CHIEF COMPLAINT:   Problems sleeping   HISTORY OF PRESENT ILLNESS   60 yo white male seen today for problems sleeping assocated with excessive fatigue and tiredness for last several years Patient was told he snores really loud and wife states that he makes popping noises as he sleeps and may stop breathing at night He falls asleep at 11 pm and wakes up around 3 AM and has problems falling back asleep He has non-refreshed  sleep intyermittently Patient dx with ASTHMA as child, well controlled with advair for last 10 years  No signs of infection at this time     PAST MEDICAL HISTORY   Past Medical History:  Diagnosis Date  . Allergic rhinitis   . Anemia    mild, from blood transfusion  . Asthma   . Retinal detachment      SURGICAL HISTORY   Past Surgical History:  Procedure Laterality Date  . CATARACT EXTRACTION  2010   left eye  . RETINAL DETACHMENT SURGERY       FAMILY HISTORY   Family History  Problem Relation Age of Onset  . Aneurysm Sister     brain  . Tracheal cancer Brother     lung, smoker  . Prostate cancer Neg Hx   . Renal cancer Neg Hx      SOCIAL HISTORY   Social History  Substance Use Topics  . Smoking status: Never Smoker  . Smokeless tobacco: Never Used  . Alcohol use 0.0 oz/week     Comment: occ     MEDICATIONS    Home Medication:  Current Outpatient Rx  . Order #: [id number] Class:  Historical Med  . Order #: [id number] Class: Print    Current Medication:  Current Outpatient Prescriptions:  .  aspirin 81 MG tablet, Take 81 mg by mouth daily. Reported on 06/28/2015, Disp: , Rfl:  .  Fluticasone-Salmeterol (ADVAIR DISKUS) 100-50 MCG/DOSE AEPB, Inhale 1 puff into the lungs 2 (two) times daily., Disp: 3 each, Rfl: 3    ALLERGIES   Review of patient's allergies indicates no known allergies.     REVIEW OF SYSTEMS   Review of Systems  Constitutional: Positive for malaise/fatigue. Negative for chills, diaphoresis, fever and weight loss.  HENT: Negative for congestion and hearing loss.   Eyes: Negative for blurred vision and double vision.  Respiratory: Negative for cough, hemoptysis, sputum production, shortness of breath and wheezing.   Cardiovascular: Negative for chest pain, palpitations and orthopnea.  Gastrointestinal: Negative for abdominal pain, heartburn, nausea and vomiting.  Genitourinary: Negative for dysuria.  Musculoskeletal: Negative for back pain.  Skin: Negative for rash.  Neurological: Negative for dizziness, tingling, tremors, weakness and headaches.  Endo/Heme/Allergies: Does not bruise/bleed easily.  Psychiatric/Behavioral: Negative for depression, substance abuse and suicidal ideas.  All other systems reviewed and are negative.  VS: BP 118/62 (BP Location: Left Arm, Cuff Size: Normal)   Pulse 60   Ht 5\' 8"  (1.727 m)   Wt 142 lb (64.4 kg)   SpO2 100%   BMI 21.59 kg/m      PHYSICAL EXAM  Physical Exam  Constitutional: He is oriented to person, place, and time. He appears well-developed and well-nourished. No distress.  HENT:  Head: Normocephalic and atraumatic.  Mouth/Throat: No oropharyngeal exudate.  Eyes: EOM are normal. Pupils are equal, round, and reactive to light. No scleral icterus.  Neck: Normal range of motion. Neck supple.  Cardiovascular: Normal rate, regular rhythm and normal heart sounds.   No murmur  heard. Pulmonary/Chest: No stridor. No respiratory distress. He has no wheezes.  Abdominal: Soft. Bowel sounds are normal.  Musculoskeletal: Normal range of motion. He exhibits no edema.  Neurological: He is alert and oriented to person, place, and time. No cranial nerve deficit.  Skin: Skin is warm. He is not diaphoretic.  Psychiatric: He has a normal mood and affect.              ASSESSMENT/PLAN   60 yo white male with problems sleeping with witnessd apneas and loud snoring with daytime fatigue, with underlying well controlled mild intermittent uncomplicated ASTHMA   1.Patient will need sleep study ASAP 2.cotninue advair      The Patient requires high complexity decision making for assessment and support, frequent evaluation and titration of therapies, application of advanced monitoring technologies and extensive interpretation of multiple databases. Patient satisfied with Plan of action and management. All questions answered  Corrin Parker, M.D.  [facility]  Medical Director [place] Director [facility] Cardio-Pulmonary Department

## 2015-09-27 NOTE — Patient Instructions (Signed)
Obtain sleep study  Sleep Apnea  Sleep apnea is a sleep disorder characterized by abnormal pauses in breathing while you sleep. When your breathing pauses, the level of oxygen in your blood decreases. This causes you to move out of deep sleep and into light sleep. As a result, your quality of sleep is poor, and the system that carries your blood throughout your body (cardiovascular system) experiences stress. If sleep apnea remains untreated, the following conditions can develop:  High blood pressure (hypertension).  Coronary artery disease.  Inability to achieve or maintain an erection (impotence).  Impairment of your thought process (cognitive dysfunction). There are three types of sleep apnea: 1. Obstructive sleep apnea--Pauses in breathing during sleep because of a blocked airway. 2. Central sleep apnea--Pauses in breathing during sleep because the area of the brain that controls your breathing does not send the correct signals to the muscles that control breathing. 3. Mixed sleep apnea--A combination of both obstructive and central sleep apnea. RISK FACTORS The following risk factors can increase your risk of developing sleep apnea:  Being overweight.  Smoking.  Having narrow passages in your nose and throat.  Being of older age.  Being male.  Alcohol use.  Sedative and tranquilizer use.  Ethnicity. Among individuals younger than 35 years, African Americans are at increased risk of sleep apnea. SYMPTOMS   Difficulty staying asleep.  Daytime sleepiness and fatigue.  Loss of energy.  Irritability.  Loud, heavy snoring.  Morning headaches.  Trouble concentrating.  Forgetfulness.  Decreased interest in sex.  Unexplained sleepiness. DIAGNOSIS  In order to diagnose sleep apnea, your caregiver will perform a physical examination. A sleep study done in the comfort of your own home may be appropriate if you are otherwise healthy. Your caregiver may also recommend  that you spend the night in a sleep lab. In the sleep lab, several monitors record information about your heart, lungs, and brain while you sleep. Your leg and arm movements and blood oxygen level are also recorded. TREATMENT The following actions may help to resolve mild sleep apnea:  Sleeping on your side.   Using a decongestant if you have nasal congestion.   Avoiding the use of depressants, including alcohol, sedatives, and narcotics.   Losing weight and modifying your diet if you are overweight. There also are devices and treatments to help open your airway:  Oral appliances. These are custom-made mouthpieces that shift your lower jaw forward and slightly open your bite. This opens your airway.  Devices that create positive airway pressure. This positive pressure "splints" your airway open to help you breathe better during sleep. The following devices create positive airway pressure:  Continuous positive airway pressure (CPAP) device. The CPAP device creates a continuous level of air pressure with an air pump. The air is delivered to your airway through a mask while you sleep. This continuous pressure keeps your airway open.  Nasal expiratory positive airway pressure (EPAP) device. The EPAP device creates positive air pressure as you exhale. The device consists of single-use valves, which are inserted into each nostril and held in place by adhesive. The valves create very little resistance when you inhale but create much more resistance when you exhale. That increased resistance creates the positive airway pressure. This positive pressure while you exhale keeps your airway open, making it easier to breath when you inhale again.  Bilevel positive airway pressure (BPAP) device. The BPAP device is used mainly in patients with central sleep apnea. This device is similar to  the CPAP device because it also uses an air pump to deliver continuous air pressure through a mask. However, with the  BPAP machine, the pressure is set at two different levels. The pressure when you exhale is lower than the pressure when you inhale.  Surgery. Typically, surgery is only done if you cannot comply with less invasive treatments or if the less invasive treatments do not improve your condition. Surgery involves removing excess tissue in your airway to create a wider passage way.   This information is not intended to replace advice given to you by your health care provider. Make sure you discuss any questions you have with your health care provider.   Document Released: 01/30/2002 Document Revised: 03/02/2014 Document Reviewed: 06/18/2011 Elsevier Interactive Patient Education [employer].

## 2015-09-27 NOTE — Progress Notes (Signed)
09/27/2015 9:05 AM   Matthew [place] 1955-12-03 [id number]  Referring provider: Abner Greenspan, MD [address] [place], [place] [address]  Chief Complaint  Patient presents with  . Biopsy    prostate biopsy     HPI: The patient is a 60 year old gentleman who recently underwent a prostate biopsy for a rising PSA. His prostate biopsy results showed 1 core of Gleason 3+3 = 6 prostate cancer. Only 5% of this core was involved. It was the core from the right lateral apex. He also had high-grade pin in the right lateral mid.  He presents today for repeat biopsy for active surveillance.   PMH: Past Medical History:  Diagnosis Date  . Allergic rhinitis   . Anemia    mild, from blood transfusion  . Asthma   . Retinal detachment     Surgical History: Past Surgical History:  Procedure Laterality Date  . CATARACT EXTRACTION  2010   left eye  . RETINAL DETACHMENT SURGERY      Home Medications:    Medication List       Accurate as of 09/27/15  9:05 AM. Always use your most recent med list.          aspirin 81 MG tablet Take 81 mg by mouth daily. Reported on 06/28/2015   Fluticasone-Salmeterol 100-50 MCG/DOSE Aepb Commonly known as:  ADVAIR DISKUS Inhale 1 puff into the lungs 2 (two) times daily.       Allergies: No Known Allergies  Family History: Family History  Problem Relation Age of Onset  . Aneurysm Sister     brain  . Tracheal cancer Brother     lung, smoker  . Prostate cancer Neg Hx   . Renal cancer Neg Hx     Social History:  reports that he has never smoked. He does not have any smokeless tobacco history on file. He reports that he drinks alcohol. He reports that he does not use drugs.  ROS:                                        Physical Exam: BP 104/60   Pulse (!) 49   Ht 5\' 8"  (1.727 m)   Wt 141 lb (64 kg)   BMI 21.44 kg/m   Constitutional:  Alert and oriented, No acute  distress. HEENT: [place] AT, moist mucus membranes.  Trachea midline, no masses. Cardiovascular: No clubbing, cyanosis, or edema. Respiratory: Normal respiratory effort, no increased work of breathing. GI: Abdomen is soft, nontender, nondistended, no abdominal masses GU: No CVA tenderness.  Skin: No rashes, bruises or suspicious lesions. Lymph: No cervical or inguinal adenopathy. Neurologic: Grossly intact, no focal deficits, moving all 4 extremities. Psychiatric: Normal mood and affect.  Laboratory Data: Lab Results  Component Value Date   WBC 3.4 (L) 05/06/2015   HGB 14.1 05/06/2015   HCT 41.5 05/06/2015   MCV 86.3 05/06/2015   PLT 155.0 05/06/2015    Lab Results  Component Value Date   CREATININE 0.87 05/06/2015    Lab Results  Component Value Date   PSA 3.73 05/06/2015   PSA 2.17 05/04/2014   PSA 1.98 10/02/2011    No results found for: TESTOSTERONE  Lab Results  Component Value Date   HGBA1C 6.1 05/10/2015    Urinalysis    Component Value Date/Time   APPEARANCEUR Clear 05/28/2015 1017  GLUCOSEU Negative 05/28/2015 1017   BILIRUBINUR Negative 05/28/2015 1017   PROTEINUR Negative 05/28/2015 1017   UROBILINOGEN 0.2 05/27/2010 1535   NITRITE Negative 05/28/2015 1017   LEUKOCYTESUR Negative 05/28/2015 1017   Prostate Biopsy Procedure   Informed consent was obtained after discussing risks/benefits of the procedure.  A time out was performed to ensure correct patient identity.  Pre-Procedure: - Last PSA Level:  Lab Results  Component Value Date   PSA 3.73 05/06/2015   PSA 2.17 05/04/2014   PSA 1.98 10/02/2011   - Gentamicin given prophylactically - Levaquin 500 mg administered PO -Transrectal Ultrasound performed revealing a 37 gm prostate -No significant hypoechoic or median lobe noted  Procedure: - Prostate block performed using 10 cc 1% lidocaine and biopsies taken from sextant areas, a total of 12 under ultrasound guidance.  Post-Procedure: -  Patient tolerated the procedure well - He was counseled to seek immediate medical attention if experiences any severe pain, significant bleeding, or fevers - Return in one week to discuss biopsy results   Assessment & Plan:    1. Prostate cancer -follow up for prostate biopsy results  Nickie Retort, [place] [address], [place], [place] [address] [phone]

## 2015-10-01 DIAGNOSIS — G4733 Obstructive sleep apnea (adult) (pediatric): Secondary | ICD-10-CM | POA: Diagnosis not present

## 2015-10-01 LAB — PATHOLOGY REPORT

## 2015-10-02 ENCOUNTER — Other Ambulatory Visit: Payer: Self-pay | Admitting: Urology

## 2015-10-03 DIAGNOSIS — G4733 Obstructive sleep apnea (adult) (pediatric): Secondary | ICD-10-CM | POA: Diagnosis not present

## 2015-10-04 ENCOUNTER — Ambulatory Visit: Payer: 59 | Admitting: Urology

## 2015-10-04 ENCOUNTER — Other Ambulatory Visit: Payer: Self-pay | Admitting: *Deleted

## 2015-10-04 ENCOUNTER — Telehealth: Payer: Self-pay

## 2015-10-04 DIAGNOSIS — G4719 Other hypersomnia: Secondary | ICD-10-CM

## 2015-10-04 DIAGNOSIS — R0683 Snoring: Secondary | ICD-10-CM

## 2015-10-04 NOTE — Telephone Encounter (Signed)
Per Dr. Pilar Jarvis patient was notified that prostate biopsy was negative and today's apt could be cancelled and we could see him in 19months with a PSA 1 week prior, patient was transferred to the front to have apt rescheduled.

## 2015-10-07 ENCOUNTER — Other Ambulatory Visit: Payer: Self-pay | Admitting: Urology

## 2015-10-09 ENCOUNTER — Telehealth: Payer: Self-pay | Admitting: *Deleted

## 2015-10-09 DIAGNOSIS — G4733 Obstructive sleep apnea (adult) (pediatric): Secondary | ICD-10-CM

## 2015-10-09 NOTE — Telephone Encounter (Signed)
Pt informed of sleep study results and order palced for CPAP titration. Will r/s f/u appt after setup on CPAP. Nothing further needed.

## 2015-10-10 ENCOUNTER — Telehealth: Payer: Self-pay | Admitting: Internal Medicine

## 2015-10-10 DIAGNOSIS — G4733 Obstructive sleep apnea (adult) (pediatric): Secondary | ICD-10-CM

## 2015-10-10 NOTE — Telephone Encounter (Signed)
Please advise on an auto setting for this pt since insurance will not approve in lad titration study. Thanks

## 2015-10-10 NOTE — Telephone Encounter (Signed)
Per patient's insurance, [employer] prefers Auto CPAP provided by DME with a Range of pressure x 2 wks with download.  If this is ok, please cancel in lab cpap titration order and place referral for DME to set up Auto Pap. Rhonda J Cobb

## 2015-10-14 NOTE — Telephone Encounter (Signed)
Auto cpap 5-15

## 2015-10-16 NOTE — Telephone Encounter (Signed)
Spoke with wife who gave me pt's cell number [phone]). Called number and LM for pt to return call in regards to letting him know we are ordering an auto CPAP.

## 2015-10-16 NOTE — Telephone Encounter (Signed)
Pt called back and was informed we are setting him up on an auto CPAP and when he should f/u. Order placed. Nothing further needed.

## 2015-10-30 ENCOUNTER — Ambulatory Visit: Payer: 59 | Admitting: Internal Medicine

## 2015-11-02 ENCOUNTER — Telehealth: Payer: Self-pay | Admitting: Family Medicine

## 2015-11-02 DIAGNOSIS — N400 Enlarged prostate without lower urinary tract symptoms: Secondary | ICD-10-CM

## 2015-11-02 DIAGNOSIS — R739 Hyperglycemia, unspecified: Secondary | ICD-10-CM

## 2015-11-02 DIAGNOSIS — Z0000 Encounter for general adult medical examination without abnormal findings: Secondary | ICD-10-CM

## 2015-11-02 NOTE — Telephone Encounter (Signed)
-----   Message from Marchia Bond sent at 11/01/2015  1:38 PM EDT ----- Regarding: Cpx labs Thurs 9/14, need orders. Thanks :-) Please order  future cpx labs for pt's upcoming lab appt. Thanks Aniceto Boss

## 2015-11-07 ENCOUNTER — Other Ambulatory Visit (INDEPENDENT_AMBULATORY_CARE_PROVIDER_SITE_OTHER): Payer: 59

## 2015-11-07 DIAGNOSIS — N400 Enlarged prostate without lower urinary tract symptoms: Secondary | ICD-10-CM

## 2015-11-07 DIAGNOSIS — R739 Hyperglycemia, unspecified: Secondary | ICD-10-CM

## 2015-11-07 DIAGNOSIS — Z0000 Encounter for general adult medical examination without abnormal findings: Secondary | ICD-10-CM | POA: Diagnosis not present

## 2015-11-07 LAB — LIPID PANEL
CHOL/HDL RATIO: 2
Cholesterol: 164 mg/dL (ref 0–200)
HDL: 70.3 mg/dL (ref >39.00)
LDL Cholesterol: 87 mg/dL (ref 0–99)
NONHDL: 93.32
Triglycerides: 31 mg/dL (ref 0.0–149.0)
VLDL: 6.2 mg/dL (ref 0.0–40.0)

## 2015-11-07 LAB — COMPREHENSIVE METABOLIC PANEL
ALBUMIN: 4.2 g/dL (ref 3.5–5.2)
ALT: 19 U/L (ref 0–53)
AST: 23 U/L (ref 0–37)
Alkaline Phosphatase: 32 U/L — ABNORMAL LOW (ref 39–117)
BUN: 16 mg/dL (ref 6–23)
CHLORIDE: 105 meq/L (ref 96–112)
CO2: 29 mEq/L (ref 19–32)
CREATININE: 0.8 mg/dL (ref 0.40–1.50)
Calcium: 8.9 mg/dL (ref 8.4–10.5)
GFR: 105.52 mL/min (ref >60.00)
GLUCOSE: 136 mg/dL — AB (ref 70–99)
Potassium: 4 mEq/L (ref 3.5–5.1)
SODIUM: 139 meq/L (ref 135–145)
TOTAL PROTEIN: 6.2 g/dL (ref 6.0–8.3)
Total Bilirubin: 0.5 mg/dL (ref 0.2–1.2)

## 2015-11-07 LAB — CBC WITH DIFFERENTIAL/PLATELET
Basophils Absolute: 0 10*3/uL (ref 0.0–0.1)
Basophils Relative: 0.7 % (ref 0.0–3.0)
EOS ABS: 0.2 10*3/uL (ref 0.0–0.7)
Eosinophils Relative: 5.2 % — ABNORMAL HIGH (ref 0.0–5.0)
HCT: 39.7 % (ref 39.0–52.0)
HEMOGLOBIN: 13.6 g/dL (ref 13.0–17.0)
LYMPHS ABS: 1.4 10*3/uL (ref 0.7–4.0)
Lymphocytes Relative: 37.4 % (ref 12.0–46.0)
MCHC: 34.3 g/dL (ref 30.0–36.0)
MCV: 86.5 fl (ref 78.0–100.0)
MONO ABS: 0.3 10*3/uL (ref 0.1–1.0)
Monocytes Relative: 8.4 % (ref 3.0–12.0)
Neutro Abs: 1.8 10*3/uL (ref 1.4–7.7)
Neutrophils Relative %: 48.3 % (ref 43.0–77.0)
Platelets: 159 10*3/uL (ref 150.0–400.0)
RBC: 4.59 Mil/uL (ref 4.22–5.81)
RDW: 14.6 % (ref 11.5–15.5)
WBC: 3.8 10*3/uL — AB (ref 4.0–10.5)

## 2015-11-07 LAB — PSA: PSA: 2.84 ng/mL (ref 0.10–4.00)

## 2015-11-07 LAB — HEMOGLOBIN A1C: HEMOGLOBIN A1C: 5.9 % (ref 4.6–6.5)

## 2015-11-07 LAB — TSH: TSH: 1.57 u[IU]/mL (ref 0.35–4.50)

## 2015-11-08 ENCOUNTER — Other Ambulatory Visit: Payer: Self-pay

## 2015-11-08 ENCOUNTER — Encounter: Payer: Self-pay | Admitting: *Deleted

## 2015-11-08 MED ORDER — FLUTICASONE-SALMETEROL 100-50 MCG/DOSE IN AEPB: 1.0000 | INHALATION_SPRAY | Freq: Two times a day (BID) | RESPIRATORY_TRACT | 1 refills | Status: DC

## 2015-11-08 NOTE — Telephone Encounter (Signed)
Pt left v/m requesting refill advair to [facility] [address]. Last annual 05/10/15. I spoke with pts wife (DPR signed) to let know refill done as requested. Mrs Barreras voiced understanding.

## 2015-11-12 ENCOUNTER — Other Ambulatory Visit: Payer: Self-pay

## 2015-11-12 MED ORDER — FLUTICASONE-SALMETEROL 100-50 MCG/DOSE IN AEPB: 1.0000 | INHALATION_SPRAY | Freq: Two times a day (BID) | RESPIRATORY_TRACT | 3 refills | Status: DC

## 2015-11-12 NOTE — Telephone Encounter (Signed)
Px printed for pick up in IN box  

## 2015-11-12 NOTE — Telephone Encounter (Signed)
Pt notified Rx ready for pickup 

## 2015-11-12 NOTE — Telephone Encounter (Signed)
Pt left v/m; pt having problems getting advair at [facility] due to ins. Pt request advair rx at front desk and pt will send to mail order co. Pt request cb when ready for pick up. Last annual 05/10/15.

## 2015-12-13 ENCOUNTER — Ambulatory Visit: Payer: 59 | Admitting: Internal Medicine

## 2015-12-13 ENCOUNTER — Encounter: Payer: Self-pay | Admitting: Internal Medicine

## 2015-12-13 ENCOUNTER — Ambulatory Visit (INDEPENDENT_AMBULATORY_CARE_PROVIDER_SITE_OTHER): Payer: 59 | Admitting: Internal Medicine

## 2015-12-13 VITALS — BP 110/64 | HR 63 | Ht 68.0 in | Wt 141.0 lb

## 2015-12-13 DIAGNOSIS — G4730 Sleep apnea, unspecified: Secondary | ICD-10-CM

## 2015-12-13 MED ORDER — ALBUTEROL SULFATE HFA 108 (90 BASE) MCG/ACT IN AERS: 2.0000 | INHALATION_SPRAY | RESPIRATORY_TRACT | 2 refills | Status: DC | PRN

## 2015-12-13 NOTE — Progress Notes (Signed)
[facility] Consultation      Date: 12/13/2015,   MRN# [id number] Matthew [place] 05-26-1955 Code Status:  Code Status History    This patient does not have a recorded code status. Please follow your organizational policy for patients in this situation.     Hosp day:@LENGTHOFSTAYDAYS @ Referring MD: @ATDPROV @     PCP:      AdmissionWeight: 141 lb (64 kg)                 CurrentWeight: 141 lb (64 kg) Matthew [place] is a 60 y.o. old male seen in consultation for sleep problems at the request of patient.     CHIEF COMPLAINT:   Problems sleeping   HISTORY OF PRESENT ILLNESS   Patient dx with OSA Compliance reports shows AHI 6, 100% compliance Auto CPAP 5-15 cmh20 Patient doing well, feels much better since therapy Asthma well controlled with Avair No signs of infection No cough, SOB no wheezing    Current Medication:  Current Outpatient Prescriptions:  .  aspirin 81 MG tablet, Take 81 mg by mouth daily. Reported on 06/28/2015, Disp: , Rfl:  .  Fluticasone-Salmeterol (ADVAIR DISKUS) 100-50 MCG/DOSE AEPB, Inhale 1 puff into the lungs 2 (two) times daily., Disp: 3 each, Rfl: 3    ALLERGIES   Review of patient's allergies indicates no known allergies.     REVIEW OF SYSTEMS   Review of Systems  Constitutional: Negative for chills, diaphoresis, fever, malaise/fatigue and weight loss.  HENT: Negative for congestion and hearing loss.   Respiratory: Negative for cough, hemoptysis, sputum production, shortness of breath and wheezing.   Cardiovascular: Negative for chest pain, palpitations, orthopnea and leg swelling.  Gastrointestinal: Negative for abdominal pain, heartburn, nausea and vomiting.  Skin: Negative for rash.  Neurological: Negative for weakness and headaches.  All other systems reviewed and are negative.    VS: BP 110/64 (BP Location: Left Arm, Cuff Size: Normal)   Pulse 63   Ht 5\' 8"  (1.727 m)   Wt 141 lb (64 kg)   SpO2 95%    BMI 21.44 kg/m      PHYSICAL EXAM  Physical Exam  Constitutional: He is oriented to person, place, and time. He appears well-developed and well-nourished. No distress.  HENT:  Mouth/Throat: No oropharyngeal exudate.  Eyes: No scleral icterus.  Neck: Neck supple.  Cardiovascular: Normal rate, regular rhythm and normal heart sounds.   No murmur heard. Pulmonary/Chest: Effort normal and breath sounds normal. No stridor. No respiratory distress. He has no wheezes.  Musculoskeletal: Normal range of motion. He exhibits no edema.  Neurological: He is alert and oriented to person, place, and time. No cranial nerve deficit.  Skin: Skin is warm. He is not diaphoretic.  Psychiatric: He has a normal mood and affect.     ASSESSMENT/PLAN   60 yo white male with problems sleeping with witnessd apneas and loud snoring with daytime fatigue now with Dx of OSA with underlying well controlled mild intermittent uncomplicated ASTHMA   1.continue Auto CPAP 5-15 cm h20 2.will try Albuterol as needed and hold Advair 3.will try Xanax for anxiety if patient requests   The Patient requires high complexity decision making for assessment and support, frequent evaluation and titration of therapies, application of advanced monitoring technologies and extensive interpretation of multiple databases. Patient satisfied with Plan of action and management. All questions answered  Corrin Parker, M.D.  [facility]  Medical Director [place] Director  [facility] Cardio-Pulmonary Department

## 2015-12-13 NOTE — Patient Instructions (Signed)
Hold ADVAIR FOR NOW Will try Albuterol 2 puffs every 4 hrs as needed Will consider Xanax if anxiety persists  Sleep Apnea  Sleep apnea is a sleep disorder characterized by abnormal pauses in breathing while you sleep. When your breathing pauses, the level of oxygen in your blood decreases. This causes you to move out of deep sleep and into light sleep. As a result, your quality of sleep is poor, and the system that carries your blood throughout your body (cardiovascular system) experiences stress. If sleep apnea remains untreated, the following conditions can develop:  High blood pressure (hypertension).  Coronary artery disease.  Inability to achieve or maintain an erection (impotence).  Impairment of your thought process (cognitive dysfunction). There are three types of sleep apnea: 1. Obstructive sleep apnea--Pauses in breathing during sleep because of a blocked airway. 2. Central sleep apnea--Pauses in breathing during sleep because the area of the brain that controls your breathing does not send the correct signals to the muscles that control breathing. 3. Mixed sleep apnea--A combination of both obstructive and central sleep apnea. RISK FACTORS The following risk factors can increase your risk of developing sleep apnea:  Being overweight.  Smoking.  Having narrow passages in your nose and throat.  Being of older age.  Being male.  Alcohol use.  Sedative and tranquilizer use.  Ethnicity. Among individuals younger than 35 years, African Americans are at increased risk of sleep apnea. SYMPTOMS   Difficulty staying asleep.  Daytime sleepiness and fatigue.  Loss of energy.  Irritability.  Loud, heavy snoring.  Morning headaches.  Trouble concentrating.  Forgetfulness.  Decreased interest in sex.  Unexplained sleepiness. DIAGNOSIS  In order to diagnose sleep apnea, your caregiver will perform a physical examination. A sleep study done in the comfort of your  own home may be appropriate if you are otherwise healthy. Your caregiver may also recommend that you spend the night in a sleep lab. In the sleep lab, several monitors record information about your heart, lungs, and brain while you sleep. Your leg and arm movements and blood oxygen level are also recorded. TREATMENT The following actions may help to resolve mild sleep apnea:  Sleeping on your side.   Using a decongestant if you have nasal congestion.   Avoiding the use of depressants, including alcohol, sedatives, and narcotics.   Losing weight and modifying your diet if you are overweight. There also are devices and treatments to help open your airway:  Oral appliances. These are custom-made mouthpieces that shift your lower jaw forward and slightly open your bite. This opens your airway.  Devices that create positive airway pressure. This positive pressure "splints" your airway open to help you breathe better during sleep. The following devices create positive airway pressure:  Continuous positive airway pressure (CPAP) device. The CPAP device creates a continuous level of air pressure with an air pump. The air is delivered to your airway through a mask while you sleep. This continuous pressure keeps your airway open.  Nasal expiratory positive airway pressure (EPAP) device. The EPAP device creates positive air pressure as you exhale. The device consists of single-use valves, which are inserted into each nostril and held in place by adhesive. The valves create very little resistance when you inhale but create much more resistance when you exhale. That increased resistance creates the positive airway pressure. This positive pressure while you exhale keeps your airway open, making it easier to breath when you inhale again.  Bilevel positive airway pressure (BPAP) device.  The BPAP device is used mainly in patients with central sleep apnea. This device is similar to the CPAP device because it  also uses an air pump to deliver continuous air pressure through a mask. However, with the BPAP machine, the pressure is set at two different levels. The pressure when you exhale is lower than the pressure when you inhale.  Surgery. Typically, surgery is only done if you cannot comply with less invasive treatments or if the less invasive treatments do not improve your condition. Surgery involves removing excess tissue in your airway to create a wider passage way.   This information is not intended to replace advice given to you by your health care provider. Make sure you discuss any questions you have with your health care provider.   Document Released: 01/30/2002 Document Revised: 03/02/2014 Document Reviewed: 06/18/2011 Elsevier Interactive Patient Education [employer].

## 2016-01-22 ENCOUNTER — Telehealth: Payer: Self-pay | Admitting: Internal Medicine

## 2016-01-22 MED ORDER — ALPRAZOLAM 1 MG PO TABS: 1.0000 mg | ORAL_TABLET | Freq: Every evening | ORAL | 1 refills | Status: DC | PRN

## 2016-01-22 NOTE — Telephone Encounter (Signed)
Pt is requesting something to help him sleep with his CPAP. Please advise.

## 2016-01-22 NOTE — Telephone Encounter (Signed)
Pt calling asking if we can call in a prescription to help him sleep with the sleep apnea machine Please advise

## 2016-01-22 NOTE — Telephone Encounter (Signed)
Pt informed. RX called into [facility]. Nothing further needed.

## 2016-01-22 NOTE — Telephone Encounter (Signed)
Xanax 1 mg qhs as needed

## 2016-01-22 NOTE — Telephone Encounter (Signed)
LMOM for pt to return call. 

## 2016-04-06 ENCOUNTER — Ambulatory Visit: Payer: 59

## 2016-04-17 ENCOUNTER — Ambulatory Visit (INDEPENDENT_AMBULATORY_CARE_PROVIDER_SITE_OTHER): Payer: 59 | Admitting: Urology

## 2016-04-17 ENCOUNTER — Encounter: Payer: Self-pay | Admitting: Urology

## 2016-04-17 DIAGNOSIS — C61 Malignant neoplasm of prostate: Secondary | ICD-10-CM

## 2016-04-17 NOTE — Progress Notes (Signed)
04/17/2016 7:04 AM   Matthew Snyder 1955-10-24 [id number]  Referring provider: Abner Greenspan, MD [place],  [id number]  CC: Follow up prostate cancer  HPI:  1 - Very Low Risk Prostate Cancer - on surveillance  05/2015 -1 core of Gleason 3+3 = 6 prostate cancer, 5% right lateral apex, TRUS 60mL on eval PSA rise to 3.7 ==> surveillance 09/2015 - re-BX no cancer, PSA 2.89 03/2016 - DRE XXX / PSA (today / pending)   2 - Lower Urinary Tract Symptoms  - slowly progressive bother from mostly irritative voiding symptoms with urgency, double void. TRUS 43gm.  PVR 2017 "26mL". Has elected observation.   PMH sig for TNA, Lt eye surgery. No CV disease. No blood thinners. He is runs for fitness and is an Therapist, sports at assisted living facility in [place]  His PCP is Jacqulynn Cadet MD with [facility].  Today  " [name] " is seen in f/u above. No interval urinary retention, bone pain, gross hematuria.    PMH: Past Medical History:  Diagnosis Date  . Allergic rhinitis   . Anemia    mild, from blood transfusion  . Asthma   . Retinal detachment     Surgical History: Past Surgical History:  Procedure Laterality Date  . CATARACT EXTRACTION  2010   left eye  . RETINAL DETACHMENT SURGERY      Home Medications:  Allergies as of 04/17/2016   No Known Allergies     Medication List       Accurate as of 04/17/16  7:04 AM. Always use your most recent med list.          albuterol 108 (90 Base) MCG/ACT inhaler Commonly known as:  PROVENTIL HFA;VENTOLIN HFA Inhale 2 puffs into the lungs every 4 (four) hours as needed for wheezing or shortness of breath.   ALPRAZolam 1 MG tablet Commonly known as:  XANAX Take 1 tablet (1 mg total) by mouth at bedtime as needed for sleep.   aspirin 81 MG tablet Take 81 mg by mouth daily. Reported on 06/28/2015   Fluticasone-Salmeterol 100-50 MCG/DOSE Aepb Commonly known as:  ADVAIR DISKUS Inhale 1 puff into the lungs 2 (two) times  daily.       Allergies: No Known Allergies  Family History: Family History  Problem Relation Age of Onset  . Aneurysm Sister     brain  . Tracheal cancer Brother     lung, smoker  . Prostate cancer Neg Hx   . Renal cancer Neg Hx     Social History:  reports that he has never smoked. He has never used smokeless tobacco. He reports that he drinks alcohol. He reports that he does not use drugs.  ROS:   Urological Symptom Review  Patient is experiencing the following symptoms: Frequent urination   Review of Systems  Gastrointestinal (upper)  : Negative for upper GI symptoms  Gastrointestinal (lower) : Negative for lower GI symptoms  Constitutional : Negative for symptoms  Skin: Negative for skin symptoms  Eyes: Negative for eye symptoms  Ear/Nose/Throat : Negative for Ear/Nose/Throat symptoms  Hematologic/Lymphatic: Negative for Hematologic/Lymphatic symptoms  Cardiovascular : Negative for cardiovascular symptoms  Respiratory : Negative for respiratory symptoms  Endocrine: Negative for endocrine symptoms  Musculoskeletal: Negative for musculoskeletal symptoms  Neurological: Negative for neurological symptoms  Psychologic: Negative for psychiatric symptoms        Physical Exam: There were no vitals taken for this visit.  Constitutional:  Alert and  oriented, No acute distress. HEENT: [place] AT, moist mucus membranes.  Trachea midline, no masses. Cardiovascular: No clubbing, cyanosis, or edema. Respiratory: Normal respiratory effort, no increased work of breathing. GI: Abdomen is soft, nontender, nondistended, no abdominal masses GU: No CVA tenderness. Penis ircumscised, straight. Testes down w/o masses. DRE 40gm smooth with some assymtry but no nodules. Skin: No rashes, bruises or suspicious lesions. Lymph: No cervical or inguinal adenopathy. Neurologic: Grossly intact, no focal deficits, moving all 4 extremities. Psychiatric: Normal mood and  affect.  Laboratory Data: Lab Results  Component Value Date   WBC 3.8 (L) 11/07/2015   HGB 13.6 11/07/2015   HCT 39.7 11/07/2015   MCV 86.5 11/07/2015   PLT 159.0 11/07/2015    Lab Results  Component Value Date   CREATININE 0.80 11/07/2015    Lab Results  Component Value Date   PSA 2.84 11/07/2015   PSA 3.73 05/06/2015   PSA 2.17 05/04/2014    No results found for: TESTOSTERONE  Lab Results  Component Value Date   HGBA1C 5.9 11/07/2015    Urinalysis    Component Value Date/Time   APPEARANCEUR Clear 05/28/2015 1017   GLUCOSEU Negative 05/28/2015 1017   BILIRUBINUR Negative 05/28/2015 1017   PROTEINUR Negative 05/28/2015 1017   UROBILINOGEN 0.2 05/27/2010 1535   NITRITE Negative 05/28/2015 1017   LEUKOCYTESUR Negative 05/28/2015 1017     Assessment & Plan:     1 - Very Low Risk Prostate Cancer - agree with continued surveillance with Q42mo PSA / DRE and once yearly BX alternating with MRI.   PSA today and RTC 36mo with PSA/MRI.   2 - Lower Urinary Tract Symptoms  - stable. Would consider daily tamsulosin if bother becomes progressive based on gland size.   Alexis Frock, [facility] [address], [place], [place] [address] [phone]

## 2016-04-18 LAB — PSA: Prostate Specific Ag, Serum: 2.3 ng/mL (ref 0.0–4.0)

## 2016-04-20 ENCOUNTER — Telehealth: Payer: Self-pay

## 2016-04-20 NOTE — Telephone Encounter (Signed)
Left message for pt to return call.

## 2016-04-20 NOTE — Telephone Encounter (Signed)
Spoke with pt in reference to PSA results and needing to keep f/u later on this year. Pt voiced understanding.

## 2016-04-20 NOTE — Telephone Encounter (Signed)
-----   Message from Nori Riis, PA-C sent at 04/18/2016  6:32 PM EST ----- Please notify the patient that his PSA is stable at 2.3,  He needs to keep his follow up PSA and MRI in 09/2016.

## 2016-05-03 ENCOUNTER — Telehealth: Payer: Self-pay | Admitting: Family Medicine

## 2016-05-03 DIAGNOSIS — C61 Malignant neoplasm of prostate: Secondary | ICD-10-CM

## 2016-05-03 DIAGNOSIS — Z0000 Encounter for general adult medical examination without abnormal findings: Secondary | ICD-10-CM

## 2016-05-03 DIAGNOSIS — R739 Hyperglycemia, unspecified: Secondary | ICD-10-CM

## 2016-05-03 NOTE — Telephone Encounter (Signed)
-----   Message from Marchia Bond sent at 04/29/2016  3:05 PM EST ----- Regarding: Cpx labs Fri 3/16 need orders. Thanks :-) Please order  future cpx labs for pt's upcoming lab appt. Thanks Aniceto Boss

## 2016-05-07 ENCOUNTER — Other Ambulatory Visit (INDEPENDENT_AMBULATORY_CARE_PROVIDER_SITE_OTHER): Payer: 59

## 2016-05-07 DIAGNOSIS — Z0000 Encounter for general adult medical examination without abnormal findings: Secondary | ICD-10-CM

## 2016-05-07 DIAGNOSIS — R739 Hyperglycemia, unspecified: Secondary | ICD-10-CM | POA: Diagnosis not present

## 2016-05-07 LAB — LIPID PANEL
Cholesterol: 182 mg/dL (ref 0–200)
HDL: 78.5 mg/dL (ref >39.00)
LDL Cholesterol: 98 mg/dL (ref 0–99)
NONHDL: 103.61
Total CHOL/HDL Ratio: 2
Triglycerides: 27 mg/dL (ref 0.0–149.0)
VLDL: 5.4 mg/dL (ref 0.0–40.0)

## 2016-05-07 LAB — COMPREHENSIVE METABOLIC PANEL
ALBUMIN: 4.3 g/dL (ref 3.5–5.2)
ALK PHOS: 27 U/L — AB (ref 39–117)
ALT: 17 U/L (ref 0–53)
AST: 23 U/L (ref 0–37)
BUN: 17 mg/dL (ref 6–23)
CO2: 31 mEq/L (ref 19–32)
Calcium: 9.8 mg/dL (ref 8.4–10.5)
Chloride: 103 mEq/L (ref 96–112)
Creatinine, Ser: 0.95 mg/dL (ref 0.40–1.50)
GFR: 86.39 mL/min (ref >60.00)
Glucose, Bld: 104 mg/dL — ABNORMAL HIGH (ref 70–99)
POTASSIUM: 4.5 meq/L (ref 3.5–5.1)
SODIUM: 140 meq/L (ref 135–145)
Total Bilirubin: 0.6 mg/dL (ref 0.2–1.2)
Total Protein: 6.7 g/dL (ref 6.0–8.3)

## 2016-05-07 LAB — CBC WITH DIFFERENTIAL/PLATELET
Basophils Absolute: 0 10*3/uL (ref 0.0–0.1)
Basophils Relative: 1.1 % (ref 0.0–3.0)
EOS PCT: 5.6 % — AB (ref 0.0–5.0)
Eosinophils Absolute: 0.2 10*3/uL (ref 0.0–0.7)
HEMATOCRIT: 42.4 % (ref 39.0–52.0)
HEMOGLOBIN: 14.3 g/dL (ref 13.0–17.0)
LYMPHS PCT: 41.8 % (ref 12.0–46.0)
Lymphs Abs: 1.3 10*3/uL (ref 0.7–4.0)
MCHC: 33.7 g/dL (ref 30.0–36.0)
MCV: 87.4 fl (ref 78.0–100.0)
MONO ABS: 0.2 10*3/uL (ref 0.1–1.0)
MONOS PCT: 8.2 % (ref 3.0–12.0)
Neutro Abs: 1.3 10*3/uL — ABNORMAL LOW (ref 1.4–7.7)
Neutrophils Relative %: 43.3 % (ref 43.0–77.0)
Platelets: 146 10*3/uL — ABNORMAL LOW (ref 150.0–400.0)
RBC: 4.84 Mil/uL (ref 4.22–5.81)
RDW: 14.1 % (ref 11.5–15.5)
WBC: 3 10*3/uL — AB (ref 4.0–10.5)

## 2016-05-07 LAB — TSH: TSH: 1.71 u[IU]/mL (ref 0.35–4.50)

## 2016-05-07 LAB — HEMOGLOBIN A1C: HEMOGLOBIN A1C: 6 % (ref 4.6–6.5)

## 2016-05-08 ENCOUNTER — Other Ambulatory Visit: Payer: 59

## 2016-05-11 ENCOUNTER — Ambulatory Visit (INDEPENDENT_AMBULATORY_CARE_PROVIDER_SITE_OTHER): Payer: 59 | Admitting: Family Medicine

## 2016-05-11 ENCOUNTER — Encounter: Payer: Self-pay | Admitting: Family Medicine

## 2016-05-11 VITALS — BP 118/66 | HR 64 | Temp 97.5°F | Ht 68.0 in | Wt 142.0 lb

## 2016-05-11 DIAGNOSIS — Z0000 Encounter for general adult medical examination without abnormal findings: Secondary | ICD-10-CM | POA: Diagnosis not present

## 2016-05-11 DIAGNOSIS — C61 Malignant neoplasm of prostate: Secondary | ICD-10-CM | POA: Diagnosis not present

## 2016-05-11 DIAGNOSIS — R739 Hyperglycemia, unspecified: Secondary | ICD-10-CM

## 2016-05-11 NOTE — Assessment & Plan Note (Signed)
Reviewed health habits including diet and exercise and skin cancer prevention Reviewed appropriate screening tests for age  Also reviewed health mt list, fam hx and immunization status , as well as social and family history   See HPI Labs reviewed  Enc exercise  Enc continued use of cpap  Reinforced urology plan for low grade prostate cancer  Disc dietary change for hyperglycemia  Declines screen for hep C/HIV due to low risk  He will check on coverage of zoster vaccines

## 2016-05-11 NOTE — Patient Instructions (Addendum)
If you are interested in a shingles/zoster vaccine - call your insurance to check on coverage,( you should not get it within 1 month of other vaccines) , then call us for a prescription  for it to take to a pharmacy that gives the shot , or make a nurse visit to get it here depending on your coverage  The unisom and melatonin are fine for sleep   Keep taking good care of yourself   Labs are stable   Keep watching out for sugar and refined carbohydrates   Eat high protein with good fats as well

## 2016-05-11 NOTE — Progress Notes (Signed)
Subjective:    Patient ID: Matthew Snyder, male    DOB: 16-May-1955, 61 y.o.   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  Works 4 days per week  More time for self care   Not a lot of running during the winter  Getting back to it  Working day shift - hard to fit exercise in  Jogged 75 minutes last week     Is using cpap and doing well with that   Wt Readings from Last 3 Encounters:  05/11/16 142 lb (64.4 kg)  04/17/16 140 lb 14.4 oz (63.9 kg)  12/13/15 141 lb (64 kg)  excellent  Eats healthy and working on lower sugar diet but eating more fat overall (peanuts/ uses real butter)  bmi is 21.5  Hep C/ HIV screening - not high risk/declines  He does not make ab to hep B-has been immunized    Zoster vaccine -interested if covered  Flu vaccine 10/17 Tetanus vaccine 4/09  Colonoscopy/ screening 8/10 nl with 10 year recall     Personal hx of prostate cancer  Very low risk as stated from his urologist Dr Tresa Moore obs currently with Q 6 mo PSA/DRE and once yearly bx alt with MRI  Symptoms -stable/voiding  PSA 2/18 was 2.3 at urologist  Lab Results  Component Value Date   PSA 2.84 11/07/2015   PSA 3.73 05/06/2015   PSA 2.17 05/04/2014    Brother had prostate cancer 88 years older    Hx of hyperglycemia Glucose fasting 104 Lab Results  Component Value Date   HGBA1C 6.0 05/07/2016  runs and eats right  Watching this   Baseline cbc Lab Results  Component Value Date   WBC 3.0 (L) 05/07/2016   HGB  14.3 05/07/2016   HCT 42.4 05/07/2016   MCV 87.4 05/07/2016   PLT 146.0 (L) 05/07/2016   Usually runs wbc in the 3 range  Platelets just shy of 150 No anemia  Has not been donating blood at all   Cholesterol Lab Results  Component Value Date   CHOL 182 05/07/2016   CHOL 164 11/07/2015   CHOL 168 05/06/2015   Lab Results  Component Value Date   HDL 78.50 05/07/2016   HDL 70.30 11/07/2015   HDL 69.70 05/06/2015   Lab Results  Component Value Date   LDLCALC 98 05/07/2016   LDLCALC 87 11/07/2015   LDLCALC 91 05/06/2015   Lab Results  Component Value Date   TRIG 27.0 05/07/2016   TRIG 31.0 11/07/2015   TRIG 37.0 05/06/2015   Lab Results  Component Value Date   CHOLHDL 2 05/07/2016   CHOLHDL 2 11/07/2015   CHOLHDL 2 05/06/2015   No results found for: LDLDIRECT Excellent !  Results for orders placed or performed in visit on 05/07/16  CBC with Differential/Platelet  Result Value Ref Range   WBC 3.0 (L) 4.0 - 10.5 K/uL   RBC 4.84  4.22 - 5.81 Mil/uL   Hemoglobin 14.3 13.0 - 17.0 g/dL   HCT 42.4 39.0 - 52.0 %   MCV 87.4 78.0 - 100.0 fl   MCHC 33.7 30.0 - 36.0 g/dL   RDW 14.1 11.5 - 15.5 %   Platelets 146.0 (L) 150.0 - 400.0 K/uL   Neutrophils Relative % 43.3 43.0 - 77.0 %   Lymphocytes Relative 41.8 12.0 - 46.0 %   Monocytes Relative 8.2 3.0 - 12.0 %   Eosinophils Relative 5.6 (H) 0.0 - 5.0 %   Basophils Relative 1.1 0.0 - 3.0 %   Neutro Abs 1.3 (L) 1.4 - 7.7 K/uL   Lymphs Abs 1.3 0.7 - 4.0 K/uL   Monocytes Absolute 0.2 0.1 - 1.0 K/uL   Eosinophils Absolute 0.2 0.0 - 0.7 K/uL   Basophils Absolute 0.0 0.0 - 0.1 K/uL  Comprehensive metabolic panel  Result Value Ref Range   Sodium 140 135 - 145 mEq/L   Potassium 4.5 3.5 - 5.1 mEq/L   Chloride 103 96 - 112 mEq/L   CO2 31 19 - 32 mEq/L   Glucose, Bld 104 (H) 70 - 99 mg/dL   BUN 17 6 - 23 mg/dL   Creatinine, Ser 0.95 0.40 - 1.50 mg/dL   Total Bilirubin 0.6 0.2 - 1.2 mg/dL   Alkaline Phosphatase 27 (L) 39 -  117 U/L   AST 23 0 - 37 U/L   ALT 17 0 - 53 U/L   Total Protein 6.7 6.0 - 8.3 g/dL   Albumin 4.3 3.5 - 5.2 g/dL   Calcium 9.8 8.4 - 10.5 mg/dL   GFR 86.39 >60.00 mL/min  Hemoglobin A1c  Result Value Ref Range   Hgb A1c MFr Bld 6.0 4.6 - 6.5 %  Lipid panel  Result Value Ref Range   Cholesterol 182 0 - 200 mg/dL   Triglycerides 27.0 0.0 - 149.0 mg/dL   HDL 78.50 >39.00 mg/dL   VLDL 5.4 0.0 - 40.0 mg/dL   LDL Cholesterol 98 0 - 99 mg/dL   Total CHOL/HDL Ratio 2    NonHDL 103.61   TSH  Result Value Ref Range   TSH 1.71 0.35 - 4.50 uIU/mL    Patient Active Problem List   Diagnosis Date Noted  . Prostate cancer ([place]) 04/17/2016  . Urinary urgency 05/28/2015  . Hyperglycemia 05/10/2015  . Elevated PSA 05/10/2015  . Dysplastic toenail 09/10/2014  . Routine general medical examination at a health care facility 09/27/2011  . History of retinal detachment 05/30/2010  . Other general medical examination for administrative purposes 05/27/2010  . ASTHMA 07/31/2009   Past Medical History:  Diagnosis Date  . Allergic rhinitis   . Anemia    mild, from blood transfusion  . Asthma   . Retinal detachment    Past Surgical History:  Procedure Laterality Date  . CATARACT EXTRACTION  2010   left eye  . RETINAL DETACHMENT SURGERY    . TONSILLECTOMY     child   Social History  Substance Use Topics  . Smoking status: Never Smoker  . Smokeless tobacco: Never Used  . Alcohol use 0.0 oz/week     Comment: occ   Family History  Problem Relation Age of Onset  . Aneurysm Sister     brain  . Tracheal cancer Brother     lung, smoker  . Prostate cancer Brother   . Renal cancer Neg Hx   . Bladder Cancer Neg Hx    No Known Allergies Current Outpatient Prescriptions  on File Prior to Visit  Medication Sig Dispense Refill  . albuterol (PROVENTIL HFA;VENTOLIN HFA) 108 (90 Base) MCG/ACT inhaler Inhale 2 puffs into the lungs every 4 (four) hours as needed for wheezing or shortness of  breath. 1 Inhaler 2  . ALPRAZolam (XANAX) 1 MG tablet Take 1 tablet (1 mg total) by mouth at bedtime as needed for sleep. 30 tablet 1  . aspirin 81 MG tablet Take 81 mg by mouth daily. Reported on 06/28/2015    . Fluticasone-Salmeterol (ADVAIR DISKUS) 100-50 MCG/DOSE AEPB Inhale 1 puff into the lungs 2 (two) times daily. 3 each 3  . IRON, FERROUS SULFATE, PO Take by mouth.     No current facility-administered medications on file prior to visit.     Review of Systems    Review of Systems  Constitutional: Negative for fever, appetite change, fatigue and unexpected weight change. pos for poor sleep Eyes: Negative for pain and visual disturbance.  Respiratory: Negative for cough and shortness of breath.   Cardiovascular: Negative for cp or palpitations    Gastrointestinal: Negative for nausea, diarrhea and constipation.  Genitourinary: Negative for urgency and frequency.  Skin: Negative for pallor or rash   Neurological: Negative for weakness, light-headedness, numbness and headaches.  Hematological: Negative for adenopathy. Does not bruise/bleed easily.  Psychiatric/Behavioral: Negative for dysphoric mood. The patient is not nervous/anxious.      Objective:   Physical Exam  Constitutional: He appears well-developed and well-nourished. No distress.  Well appearing   HENT:  Head: Normocephalic and atraumatic.  Right Ear: External ear normal.  Left Ear: External ear normal.  Nose: Nose normal.  Mouth/Throat: Oropharynx is clear and moist.  Boggy nares   Eyes: Conjunctivae and EOM are normal. Pupils are equal, round, and reactive to light. Right eye exhibits no discharge. Left eye exhibits no discharge. No scleral icterus.  Neck: Normal range of motion. Neck supple. No JVD present. Carotid bruit is not present. No thyromegaly present.  Cardiovascular: Normal rate, regular rhythm, normal heart sounds and intact distal pulses.  Exam reveals no gallop.   Pulmonary/Chest: Effort normal and  breath sounds normal. No respiratory distress. He has no wheezes. He has no rales. He exhibits no tenderness.  Abdominal: Soft. Bowel sounds are normal. He exhibits no distension, no abdominal bruit and no mass. There is no tenderness.  Genitourinary:  Genitourinary Comments: DRE is done by urology  Musculoskeletal: He exhibits no edema or tenderness.  Lymphadenopathy:    He has no cervical adenopathy.  Neurological: He is alert. He has normal reflexes. No cranial nerve deficit. He exhibits normal muscle tone. Coordination normal.  Skin: Skin is warm and dry. No rash noted. No erythema. No pallor.  Many brown nevi on trunk -unchanged   Psychiatric: He has a normal mood and affect.          Assessment & Plan:   Problem List Items Addressed This Visit      Genitourinary   Prostate cancer ([place]) - Primary    Pt continues obs by urology  Last PSA 2.3 Some obst symptoms from BPH He alt DRE with MRI to monitor          Other   Hyperglycemia   Routine general medical examination at a health care facility    Reviewed health habits including diet and exercise and skin cancer prevention Reviewed appropriate screening tests for age  Also reviewed health mt list, fam hx and immunization status , as well as social and family  history   See HPI Labs reviewed  Enc exercise  Enc continued use of cpap  Reinforced urology plan for low grade prostate cancer  Disc dietary change for hyperglycemia  Declines screen for hep C/HIV due to low risk  He will check on coverage of zoster vaccines

## 2016-05-11 NOTE — Progress Notes (Signed)
Pre visit review using our clinic review tool, if applicable. No additional management support is needed unless otherwise documented below in the visit note. 

## 2016-05-11 NOTE — Assessment & Plan Note (Signed)
Pt continues obs by urology  Last PSA 2.3 Some obst symptoms from BPH He alt DRE with MRI to monitor

## 2016-06-11 ENCOUNTER — Encounter: Payer: Self-pay | Admitting: Internal Medicine

## 2016-06-11 ENCOUNTER — Ambulatory Visit (INDEPENDENT_AMBULATORY_CARE_PROVIDER_SITE_OTHER): Payer: 59 | Admitting: Internal Medicine

## 2016-06-11 VITALS — BP 118/66 | HR 74 | Wt 145.0 lb

## 2016-06-11 DIAGNOSIS — G4733 Obstructive sleep apnea (adult) (pediatric): Secondary | ICD-10-CM | POA: Diagnosis not present

## 2016-06-11 MED ORDER — TRAZODONE HCL 100 MG PO TABS: 100.0000 mg | ORAL_TABLET | Freq: Every day | ORAL | 2 refills | Status: DC

## 2016-06-11 NOTE — Progress Notes (Signed)
[facility] Consultation      Date: 06/11/2016,   MRN# [id number] Matthew Snyder 13-Apr-1955 Code Status:  Code Status History    This patient does not have a recorded code status. Please follow your organizational policy for patients in this situation.     Hosp day:@Matthew Snyder @ Referring MD: @ATDPROV @     PCP:      Admission                  Current  OWYN Matthew Snyder is a 61 y.o. old male seen in consultation for sleep problems at the request of patient.     CHIEF COMPLAINT:   Follow up OSA and ASTHMA  HISTORY OF PRESENT ILLNESS   Patient dx with OSA Compliance reports shows AHI 0.3 previous was 6, 100% compliance Auto CPAP 5-15 cmh20 Patient doing well, feels much better since therapy Asthma well controlled with Advair No signs of infection No cough, SOB no wheezing Uses advair as needed Has problems staying asleep, does all the right things to fall asleep Has taken melatonin, and unisom in the past,     Current Medication:  Current Outpatient Prescriptions:  .  albuterol (PROVENTIL HFA;VENTOLIN HFA) 108 (90 Base) MCG/ACT inhaler, Inhale 2 puffs into the lungs every 4 (four) hours as needed for wheezing or shortness of breath., Disp: 1 Inhaler, Rfl: 2 .  ALPRAZolam (XANAX) 1 MG tablet, Take 1 tablet (1 mg total) by mouth at bedtime as needed for sleep., Disp: 30 tablet, Rfl: 1 .  aspirin 81 MG tablet, Take 81 mg by mouth daily. Reported on 06/28/2015, Disp: , Rfl:  .  doxylamine, Sleep, (UNISOM) 25 MG tablet, Take 12.5 mg by mouth at bedtime as needed., Disp: , Rfl:  .  Fluticasone-Salmeterol (ADVAIR DISKUS) 100-50 MCG/DOSE AEPB, Inhale 1 puff into the lungs 2 (two) times daily., Disp: 3 each, Rfl: 3 .  IRON, FERROUS SULFATE, PO, Take by mouth., Disp: , Rfl:  .  Melatonin 5 MG TABS, Take 1 tablet by mouth at bedtime as needed., Disp: , Rfl:     ALLERGIES   Patient has no known allergies.     REVIEW OF SYSTEMS   Review of Systems   Constitutional: Negative for chills, diaphoresis, fever, malaise/fatigue and weight loss.  HENT: Negative for congestion and hearing loss.   Respiratory: Negative for cough, hemoptysis, sputum production, shortness of breath and wheezing.   Cardiovascular: Negative for chest pain, palpitations, orthopnea and leg swelling.  Gastrointestinal: Negative for abdominal pain, heartburn, nausea and vomiting.  Skin: Negative for rash.  Neurological: Negative for weakness and headaches.  All other systems reviewed and are negative.  BP 118/66 (BP Location: Left Arm, Cuff Size: Normal)   Pulse 74   Wt 145 lb (65.8 kg)   SpO2 98%   BMI 22.05 kg/m     PHYSICAL EXAM  Physical Exam  Constitutional: He is oriented to person, place, and time. He appears well-developed and well-nourished. No distress.  Neck: Neck supple.  Cardiovascular: Normal rate, regular rhythm and normal heart sounds.   No murmur heard. Pulmonary/Chest: Effort normal and breath sounds normal. No stridor. No respiratory distress. He has no wheezes.  Musculoskeletal: Normal range of motion. He exhibits no edema.  Neurological: He is alert and oriented to person, place, and time. No cranial nerve deficit.     ASSESSMENT/PLAN   61 yo white male with problems sleeping with witnessd apneas and loud snoring with daytime fatigue now with Dx of  OSA with underlying well controlled mild intermittent uncomplicated ASTHMA   1.continue Auto CPAP 5-15 cm h20 2.will try Albuterol as needed and  Advair as needed 3.will try trazodone as sleep aid  If this therapy fails will set up to see Dr Juanell Fairly.    Follow up in 6 months  Candus Braud Patricia Pesa, M.D.  [facility]  Medical Director [place] Director [facility] Cardio-Pulmonary Department

## 2016-06-11 NOTE — Patient Instructions (Addendum)
Continue AutoCPAP therapy Trazodone for sleep Advair as needed

## 2016-06-12 ENCOUNTER — Ambulatory Visit: Payer: 59 | Admitting: Internal Medicine

## 2016-09-02 ENCOUNTER — Other Ambulatory Visit: Payer: Self-pay | Admitting: Urology

## 2016-09-02 ENCOUNTER — Telehealth: Payer: Self-pay | Admitting: Urology

## 2016-09-02 MED ORDER — DIAZEPAM 10 MG PO TABS: 10.0000 mg | ORAL_TABLET | Freq: Once | ORAL | 0 refills | Status: AC

## 2016-09-02 NOTE — Telephone Encounter (Signed)
This patient is having a prostate biopsy in [place] and will need a script for Valium can you write this for? They require this before they can have one done.  He was seeing Tresa Moore but since he can't see him on the days that the patient can come in he has asked to see you.  Thanks, Sharyn Lull

## 2016-09-03 ENCOUNTER — Telehealth: Payer: Self-pay | Admitting: Urology

## 2016-09-03 NOTE — Telephone Encounter (Signed)
Just FYI I shredded the script for the Valium since we are going to be holding off on the MRI for now.   Sharyn Lull

## 2016-09-03 NOTE — Telephone Encounter (Signed)
I tried to get the MRI approved today and they would not approve it because the patient had not had a recent biopsy or recent PSA labs done. I spoke with Dr. Pilar Jarvis and we are going to hold off on the MRI for now and see him back on his follow up appt and see how his PSA results are. Spoke with the patient and he is ok with this plan.   Sharyn Lull

## 2016-10-15 ENCOUNTER — Other Ambulatory Visit: Payer: 59

## 2016-10-15 DIAGNOSIS — C61 Malignant neoplasm of prostate: Secondary | ICD-10-CM

## 2016-10-16 LAB — PSA: PROSTATE SPECIFIC AG, SERUM: 4.6 ng/mL — AB (ref 0.0–4.0)

## 2016-10-22 ENCOUNTER — Ambulatory Visit (INDEPENDENT_AMBULATORY_CARE_PROVIDER_SITE_OTHER): Payer: 59 | Admitting: Urology

## 2016-10-22 ENCOUNTER — Encounter: Payer: Self-pay | Admitting: Urology

## 2016-10-22 VITALS — BP 123/60 | HR 54 | Ht 68.0 in | Wt 141.2 lb

## 2016-10-22 DIAGNOSIS — C61 Malignant neoplasm of prostate: Secondary | ICD-10-CM

## 2016-10-22 MED ORDER — DIAZEPAM 10 MG PO TABS: 10.0000 mg | ORAL_TABLET | Freq: Once | ORAL | 0 refills | Status: AC

## 2016-10-22 NOTE — Progress Notes (Signed)
10/22/2016 11:55 AM   [place] 13-Jan-1956 [id number]  Referring provider: Abner Greenspan, MD [address] [place], [place] [address]  Chief Complaint  Patient presents with  . Prostate Cancer    HPI: 1 - Very Low Risk Prostate Cancer - on surveillance  05/2015 -1 core of Gleason 3+3 = 6 prostate cancer, 5% right lateral apex, TRUS 60mL on eval PSA rise to 3.7 ==> surveillance 09/2015 - re-BX no cancer, PSA 2.89 03/2016 - DRE 40 gm w/ mild asymmetry, no nodules  / PSA 2.3 09/2016 - PSA 4.6  2 - Lower Urinary Tract Symptoms  - slowly progressive bother from mostly irritative voiding symptoms with urgency, double void. TRUS 43gm.  PVR 2017 "33mL". Has elected observation.   PMH sig for TNA, Lt eye surgery. No CV disease. No blood thinners. 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  Today  " [place] " is seen in f/u above. No interval urinary retention, bone pain, gross hematuria.       PMH: Past Medical History:  Diagnosis Date  . Allergic rhinitis   . Anemia    mild, from blood transfusion  . Asthma   . Retinal detachment     Surgical History: Past Surgical History:  Procedure Laterality Date  . CATARACT EXTRACTION  2010   left eye  . RETINAL DETACHMENT SURGERY    . TONSILLECTOMY     child    Home Medications:  Allergies as of 10/22/2016   No Known Allergies     Medication List       Accurate as of 10/22/16 11:55 AM. Always use your most recent med list.          albuterol 108 (90 Base) MCG/ACT inhaler Commonly known as:  PROVENTIL HFA;VENTOLIN HFA Inhale 2 puffs into the lungs every 4 (four) hours as needed for wheezing or shortness of breath.   ALPRAZolam 1 MG tablet Commonly known as:  XANAX Take 1 tablet (1 mg total) by mouth at bedtime as needed for sleep.   aspirin 81 MG tablet Take 81 mg by mouth daily. Reported on 06/28/2015   diazepam 10 MG  tablet Commonly known as:  VALIUM Take 1 tablet (10 mg total) by mouth once.   doxylamine (Sleep) 25 MG tablet Commonly known as:  UNISOM Take 25 mg by mouth at bedtime as needed.   Fluticasone-Salmeterol 100-50 MCG/DOSE Aepb Commonly known as:  ADVAIR DISKUS Inhale 1 puff into the lungs 2 (two) times daily.   IRON (FERROUS SULFATE) PO Take by mouth.   Melatonin 5 MG Tabs Take 1 tablet by mouth at bedtime as needed.   traZODone 100 MG tablet Commonly known as:  DESYREL Take 1 tablet (100 mg total) by mouth at bedtime.            Discharge Care Instructions        Start     Ordered   10/22/16 0000  MR PROSTATE W WO CONTRAST    Question Answer Comment  If indicated for the ordered procedure, I authorize the administration of contrast media per Radiology protocol Yes   What is the patient's sedation requirement? Anti-anxiety   Does the patient have a pacemaker or implanted devices? No   Radiology Contrast Protocol - do NOT remove file path \\[email or web address]   Reason for Exam additional comments rising PSA after low risk prostate cancer  diagnosis   Preferred imaging location? [facility] (table limit-350 lbs)      10/22/16 1152   10/22/16 0000  diazepam (VALIUM) 10 MG tablet   Once     10/22/16 1152      Allergies: No Known Allergies  Family History: Family History  Problem Relation Age of Onset  . Aneurysm Sister        brain  . Tracheal cancer Brother        lung, smoker  . Prostate cancer Brother   . Renal cancer Neg Hx   . Bladder Cancer Neg Hx     Social History:  reports that he has never smoked. He has never used smokeless tobacco. He reports that he drinks alcohol. He reports that he does not use drugs.  ROS: UROLOGY Frequent Urination?: No Hard to postpone urination?: No Burning/pain with urination?: No Get up at night to urinate?: No Leakage of urine?: No Urine stream starts and stops?: No Trouble  starting stream?: No Do you have to strain to urinate?: No Blood in urine?: No Urinary tract infection?: No Sexually transmitted disease?: No Injury to kidneys or bladder?: No Painful intercourse?: No Weak stream?: No Erection problems?: No Penile pain?: No  Gastrointestinal Nausea?: No Vomiting?: No Indigestion/heartburn?: No Diarrhea?: No Constipation?: No  Constitutional Fever: No Night sweats?: No Weight loss?: No Fatigue?: No  Skin Skin rash/lesions?: No Itching?: No  Eyes Blurred vision?: No Double vision?: No  Ears/Nose/Throat Sore throat?: No Sinus problems?: No  Hematologic/Lymphatic Swollen glands?: No Easy bruising?: No  Cardiovascular Leg swelling?: No Chest pain?: No  Respiratory Cough?: No Shortness of breath?: No  Endocrine Excessive thirst?: No  Musculoskeletal Back pain?: No Joint pain?: No  Neurological Headaches?: No Dizziness?: No  Psychologic Depression?: No Anxiety?: No  Physical Exam: BP 123/60 (BP Location: Left Arm, Patient Position: Sitting, Cuff Size: Normal)   Pulse (!) 54   Ht 5\' 8"  (1.727 m)   Wt 141 lb 3.2 oz (64 kg)   BMI 21.47 kg/m   Constitutional:  Alert and oriented, No acute distress. HEENT: [place] AT, moist mucus membranes.  Trachea midline, no masses. Cardiovascular: No clubbing, cyanosis, or edema. Respiratory: Normal respiratory effort, no increased work of breathing. GI: Abdomen is soft, nontender, nondistended, no abdominal masses GU: No CVA tenderness. 60 g smooth benign prostate. Skin: No rashes, bruises or suspicious lesions. Lymph: No cervical or inguinal adenopathy. Neurologic: Grossly intact, no focal deficits, moving all 4 extremities. Psychiatric: Normal mood and affect.  Laboratory Data: Lab Results  Component Value Date   WBC 3.0 (L) 05/07/2016   HGB 14.3 05/07/2016   HCT 42.4 05/07/2016   MCV 87.4 05/07/2016   PLT 146.0 (L) 05/07/2016    Lab Results  Component Value Date    CREATININE 0.95 05/07/2016    Lab Results  Component Value Date   PSA 2.84 11/07/2015   PSA 3.73 05/06/2015   PSA 2.17 05/04/2014    No results found for: TESTOSTERONE  Lab Results  Component Value Date   HGBA1C 6.0 05/07/2016    Urinalysis    Component Value Date/Time   APPEARANCEUR Clear 05/28/2015 1017   GLUCOSEU Negative 05/28/2015 1017   BILIRUBINUR Negative 05/28/2015 1017   PROTEINUR Negative 05/28/2015 1017   UROBILINOGEN 0.2 05/27/2010 1535   NITRITE Negative 05/28/2015 1017   LEUKOCYTESUR Negative 05/28/2015 1017     Assessment & Plan:    1 - Very Low Risk Prostate Cancer - Discussed with patient that his  PSA has doubled in the last 6 months. We discussed possible options which include saturation biopsy versus prostate MRI. The patient elected to pursue a prostate MRI given his rising PSA and previous diagnosis of very low risk prostate cancer. We'll have him follow-up for the results. If his insurance company does not approve this, he will need a saturation biopsy. He was given a prescription for Valium for the MRI.  2 - Lower Urinary Tract Symptoms  - stable. Would consider daily tamsulosin if bother becomes progressive based on gland size.   Return for after MRI prostate.  Nickie Retort, MD  [facility] [address], [place], [place] [address] [phone]

## 2016-11-03 ENCOUNTER — Ambulatory Visit (HOSPITAL_COMMUNITY)
Admission: RE | Admit: 2016-11-03 | Discharge: 2016-11-03 | Disposition: A | Discharge status: Home / Self Care | Payer: 59 | Source: Ambulatory Visit | Attending: Urology | Admitting: Urology

## 2016-11-03 DIAGNOSIS — C61 Malignant neoplasm of prostate: Secondary | ICD-10-CM | POA: Insufficient documentation

## 2016-11-03 MED ORDER — LIDOCAINE HCL 2 % EX GEL
CUTANEOUS | Status: AC
  Filled 2016-11-03: qty 30

## 2016-11-03 MED ORDER — GADOBENATE DIMEGLUMINE 529 MG/ML IV SOLN
15.0000 mL | Freq: Once | INTRAVENOUS | Status: AC | PRN
  Administered 2016-11-03: 13 mL via INTRAVENOUS

## 2016-11-03 MED ORDER — LIDOCAINE HCL 2 % EX GEL: 1.0000 "application " | Freq: Once | CUTANEOUS | Status: DC

## 2016-11-05 ENCOUNTER — Encounter: Payer: Self-pay | Admitting: Urology

## 2016-11-05 ENCOUNTER — Ambulatory Visit (INDEPENDENT_AMBULATORY_CARE_PROVIDER_SITE_OTHER): Payer: 59 | Admitting: Urology

## 2016-11-05 VITALS — BP 115/75 | HR 70 | Ht 68.0 in | Wt 139.4 lb

## 2016-11-05 DIAGNOSIS — C61 Malignant neoplasm of prostate: Secondary | ICD-10-CM | POA: Diagnosis not present

## 2016-11-05 NOTE — Progress Notes (Signed)
11/05/2016 4:12 PM   Matthew Snyder 1955-02-24 [id number]  Referring provider: Abner Greenspan, MD [address] [place], [place] [address]  Chief Complaint  Patient presents with  . Follow-up    MRI results    HPI: 1 - Very Low Risk Prostate Cancer- on surveillance  05/2015 -1 core of Gleason 3+3 = 6 prostate cancer, 5% right lateral apex, TRUS 37mL on eval PSA rise to 3.7 ==> surveillance 09/2015 - re-BX no cancer, PSA 2.89 03/2016 - DRE 40 gm w/ mild asymmetry, no nodules  / PSA 2.3 09/2016 - PSA 4.6  MRI of his prostate performed due to rising PSA showed a 1.4 cm PI-RADS 4 lesion lesion in the lateral left mid gland  2 - Lower Urinary Tract Symptoms - slowly progressive bother from mostly irritative voiding symptoms with urgency, double void. TRUS 43gm. PVR 2017 "14mL". Has elected observation.   PMH sig for TNA, Lt eye surgery. No CV disease. No blood thinners. 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  Today " [place] " is seen in f/u above. No interval urinary retention, bone pain, gross hematuria.      PMH: Past Medical History:  Diagnosis Date  . Allergic rhinitis   . Anemia    mild, from blood transfusion  . Asthma   . Retinal detachment     Surgical History: Past Surgical History:  Procedure Laterality Date  . CATARACT EXTRACTION  2010   left eye  . RETINAL DETACHMENT SURGERY    . TONSILLECTOMY     child    Home Medications:  Allergies as of 11/05/2016   No Known Allergies     Medication List       Accurate as of 11/05/16  4:12 PM. Always use your most recent med list.          albuterol 108 (90 Base) MCG/ACT inhaler Commonly known as:  PROVENTIL HFA;VENTOLIN HFA Inhale 2 puffs into the lungs every 4 (four) hours as needed for wheezing or shortness of breath.   ALPRAZolam 1 MG tablet Commonly known as:  XANAX Take 1 tablet (1 mg total) by mouth at  bedtime as needed for sleep.   aspirin 81 MG tablet Take 81 mg by mouth daily. Reported on 06/28/2015   doxylamine (Sleep) 25 MG tablet Commonly known as:  UNISOM Take 25 mg by mouth at bedtime as needed.   Fluticasone-Salmeterol 100-50 MCG/DOSE Aepb Commonly known as:  ADVAIR DISKUS Inhale 1 puff into the lungs 2 (two) times daily.   IRON (FERROUS SULFATE) PO Take by mouth.   Melatonin 5 MG Tabs Take 1 tablet by mouth at bedtime as needed.   traZODone 100 MG tablet Commonly known as:  DESYREL Take 1 tablet (100 mg total) by mouth at bedtime.            Discharge Care Instructions        Start     Ordered   11/05/16 0000  Ambulatory referral to Urology    Comments:  Refer to [facility] for MRI fusion prostate biopsy only. F/u in [place] for results.   11/05/16 1600      Allergies: No Known Allergies  Family History: Family History  Problem Relation Age of Onset  . Aneurysm Sister        brain  . Tracheal cancer Brother        lung, smoker  .  Prostate cancer Brother   . Renal cancer Neg Hx   . Bladder Cancer Neg Hx     Social History:  reports that he has never smoked. He has never used smokeless tobacco. He reports that he drinks alcohol. He reports that he does not use drugs.  ROS: UROLOGY Frequent Urination?: No Hard to postpone urination?: No Burning/pain with urination?: No Get up at night to urinate?: No Leakage of urine?: No Urine stream starts and stops?: No Trouble starting stream?: No Do you have to strain to urinate?: No Blood in urine?: No Urinary tract infection?: No Sexually transmitted disease?: No Injury to kidneys or bladder?: No Painful intercourse?: No Weak stream?: No Erection problems?: No Penile pain?: No  Gastrointestinal Nausea?: No Vomiting?: No Indigestion/heartburn?: No Diarrhea?: No Constipation?: No  Constitutional Fever: No Night sweats?: No Weight loss?: No Fatigue?: No  Skin Skin rash/lesions?:  No Itching?: No  Eyes Blurred vision?: No Double vision?: No  Ears/Nose/Throat Sore throat?: No Sinus problems?: No  Hematologic/Lymphatic Swollen glands?: No Easy bruising?: No  Cardiovascular Leg swelling?: No Chest pain?: No  Respiratory Cough?: No Shortness of breath?: No  Endocrine Excessive thirst?: No  Musculoskeletal Back pain?: No Joint pain?: No  Neurological Headaches?: No Dizziness?: No  Psychologic Depression?: No Anxiety?: No  Physical Exam: BP 115/75 (BP Location: Left Arm, Patient Position: Sitting, Cuff Size: Normal)   Pulse 70   Ht 5\' 8"  (1.727 m)   Wt 139 lb 6.4 oz (63.2 kg)   BMI 21.20 kg/m   Constitutional:  Alert and oriented, No acute distress. HEENT: [place] AT, moist mucus membranes.  Trachea midline, no masses. Cardiovascular: No clubbing, cyanosis, or edema. Respiratory: Normal respiratory effort, no increased work of breathing. GI: Abdomen is soft, nontender, nondistended, no abdominal masses GU: No CVA tenderness.  Skin: No rashes, bruises or suspicious lesions. Lymph: No cervical or inguinal adenopathy. Neurologic: Grossly intact, no focal deficits, moving all 4 extremities. Psychiatric: Normal mood and affect.  Laboratory Data: Lab Results  Component Value Date   WBC 3.0 (L) 05/07/2016   HGB 14.3 05/07/2016   HCT 42.4 05/07/2016   MCV 87.4 05/07/2016   PLT 146.0 (L) 05/07/2016    Lab Results  Component Value Date   CREATININE 0.95 05/07/2016    Lab Results  Component Value Date   PSA 2.84 11/07/2015   PSA 3.73 05/06/2015   PSA 2.17 05/04/2014    No results found for: TESTOSTERONE  Lab Results  Component Value Date   HGBA1C 6.0 05/07/2016    Urinalysis    Component Value Date/Time   APPEARANCEUR Clear 05/28/2015 1017   GLUCOSEU Negative 05/28/2015 1017   BILIRUBINUR Negative 05/28/2015 1017   PROTEINUR Negative 05/28/2015 1017   UROBILINOGEN 0.2 05/27/2010 1535   NITRITE Negative 05/28/2015 1017    LEUKOCYTESUR Negative 05/28/2015 1017    Assessment & Plan:    1 - Very Low Risk Prostate Cancer- I discussed the patient the significance of the finding of the PI-RADS 4 lesion in his left mid prostate. I recommend he undergo a fusion biopsy of this to target the lesion. We will arrange for him to have this done where this technology is available at [facility] in [place]. He'll follow-up here for results. I discussed the risks, benefits, indications of this procedure with him. He is familiar with a prostate biopsy as he has had it in the past.  2 - Lower Urinary Tract Symptoms - stable. Would consider daily tamsulosin if bother becomes  progressive based on gland size.   Return for after fusion biopsy at [facility].  Nickie Retort, MD  [facility] [address], [place], [place] [address] [phone]

## 2016-11-30 ENCOUNTER — Other Ambulatory Visit: Payer: Self-pay | Admitting: Urology

## 2016-12-03 ENCOUNTER — Ambulatory Visit (INDEPENDENT_AMBULATORY_CARE_PROVIDER_SITE_OTHER): Payer: 59 | Admitting: Urology

## 2016-12-03 ENCOUNTER — Encounter: Payer: Self-pay | Admitting: Urology

## 2016-12-03 VITALS — BP 128/75 | HR 57 | Ht 68.0 in | Wt 147.5 lb

## 2016-12-03 DIAGNOSIS — C61 Malignant neoplasm of prostate: Secondary | ICD-10-CM | POA: Diagnosis not present

## 2016-12-03 NOTE — Progress Notes (Signed)
12/03/2016 11:42 AM   Matthew Snyder 06/12/1955 [id number]  Referring provider: Abner Greenspan, MD [address] [place], [place] [address]  Chief Complaint  Patient presents with  . Follow-up    Fusion Biopsy results    HPI: 1 - Low Risk Prostate Cancer- on surveillance  05/2015 -1 core of Gleason 3+3 = 6 prostate cancer, 5% right lateral apex, TRUS 39mL on eval PSA rise to 3.7 ==> surveillance 09/2015 - re-BX no cancer, PSA 2.89 03/2016 - DRE 40 gm w/ mild asymmetry, no nodules / PSA 2.3 09/2016 - PSA 4.6 10/2016 - MRI of his prostate performed due to rising PSA showed a 1.4 cm PI-RADS 4 lesion lesion in the lateral left mid gland 11/2016 - MR fusion biopsy 5 cores of Gleason 3 + 3  = 6 - LLM (ROI), LLA, LLA, RLA, RBL 5-20%. TRUS 42 mL  2 - Lower Urinary Tract Symptoms - slowly progressive bother from mostly irritative voiding symptoms with urgency, double void. TRUS 43gm. PVR 2017 "53mL". Has elected observation.   PMH sig for TNA, Lt eye surgery. No CV disease. No blood thinners. 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  Today " [place] " is seen in f/u above. No interval urinary retention, bone pain, gross hematuria.        PMH: Past Medical History:  Diagnosis Date  . Allergic rhinitis   . Anemia    mild, from blood transfusion  . Asthma   . Retinal detachment     Surgical History: Past Surgical History:  Procedure Laterality Date  . CATARACT EXTRACTION  2010   left eye  . RETINAL DETACHMENT SURGERY    . TONSILLECTOMY     child    Home Medications:  Allergies as of 12/03/2016   No Known Allergies     Medication List       Accurate as of 12/03/16 11:42 AM. Always use your most recent med list.          albuterol 108 (90 Base) MCG/ACT inhaler Commonly known as:  PROVENTIL HFA;VENTOLIN HFA Inhale 2 puffs into the lungs every 4 (four) hours as needed for  wheezing or shortness of breath.   ALPRAZolam 1 MG tablet Commonly known as:  XANAX Take 1 tablet (1 mg total) by mouth at bedtime as needed for sleep.   aspirin 81 MG tablet Take 81 mg by mouth daily. Reported on 06/28/2015   doxylamine (Sleep) 25 MG tablet Commonly known as:  UNISOM Take 25 mg by mouth at bedtime as needed.   Fluticasone-Salmeterol 100-50 MCG/DOSE Aepb Commonly known as:  ADVAIR DISKUS Inhale 1 puff into the lungs 2 (two) times daily.   IRON (FERROUS SULFATE) PO Take by mouth.   Melatonin 5 MG Tabs Take 1 tablet by mouth at bedtime as needed.   traZODone 100 MG tablet Commonly known as:  DESYREL Take 1 tablet (100 mg total) by mouth at bedtime.       Allergies: No Known Allergies  Family History: Family History  Problem Relation Age of Onset  . Aneurysm Sister        brain  . Tracheal cancer Brother        lung, smoker  . Prostate cancer Brother   . Renal cancer Neg Hx   . Bladder Cancer Neg Hx     Social History:  reports that he has never  smoked. He has never used smokeless tobacco. He reports that he drinks alcohol. He reports that he does not use drugs.  ROS: UROLOGY Frequent Urination?: No Hard to postpone urination?: No Burning/pain with urination?: No Get up at night to urinate?: No Leakage of urine?: No Urine stream starts and stops?: No Trouble starting stream?: No Do you have to strain to urinate?: No Blood in urine?: No Urinary tract infection?: No Sexually transmitted disease?: No Injury to kidneys or bladder?: No Painful intercourse?: No Weak stream?: No Erection problems?: No Penile pain?: No  Gastrointestinal Nausea?: No Vomiting?: No Indigestion/heartburn?: No Diarrhea?: No Constipation?: No  Constitutional Fever: No Night sweats?: No Weight loss?: No Fatigue?: No  Skin Skin rash/lesions?: No Itching?: No  Eyes Blurred vision?: No Double vision?: No  Ears/Nose/Throat Sore throat?: No Sinus  problems?: No  Hematologic/Lymphatic Swollen glands?: No Easy bruising?: No  Cardiovascular Leg swelling?: No Chest pain?: No  Respiratory Cough?: No Shortness of breath?: No  Endocrine Excessive thirst?: No  Musculoskeletal Back pain?: No Joint pain?: No  Neurological Headaches?: No Dizziness?: No  Psychologic Depression?: No Anxiety?: No  Physical Exam: BP 128/75 (BP Location: Right Arm, Patient Position: Sitting, Cuff Size: Normal)   Pulse (!) 57   Ht 5\' 8"  (1.727 m)   Wt 147 lb 8 oz (66.9 kg)   BMI 22.43 kg/m   Constitutional:  Alert and oriented, No acute distress. HEENT: [place] AT, moist mucus membranes.  Trachea midline, no masses. Cardiovascular: No clubbing, cyanosis, or edema. Respiratory: Normal respiratory effort, no increased work of breathing. GI: Abdomen is soft, nontender, nondistended, no abdominal masses GU: No CVA tenderness.  Skin: No rashes, bruises or suspicious lesions. Lymph: No cervical or inguinal adenopathy. Neurologic: Grossly intact, no focal deficits, moving all 4 extremities. Psychiatric: Normal mood and affect.  Laboratory Data: Lab Results  Component Value Date   WBC 3.0 (L) 05/07/2016   HGB 14.3 05/07/2016   HCT 42.4 05/07/2016   MCV 87.4 05/07/2016   PLT 146.0 (L) 05/07/2016    Lab Results  Component Value Date   CREATININE 0.95 05/07/2016    Lab Results  Component Value Date   PSA 2.84 11/07/2015   PSA 3.73 05/06/2015   PSA 2.17 05/04/2014    No results found for: TESTOSTERONE  Lab Results  Component Value Date   HGBA1C 6.0 05/07/2016    Urinalysis    Component Value Date/Time   APPEARANCEUR Clear 05/28/2015 1017   GLUCOSEU Negative 05/28/2015 1017   BILIRUBINUR Negative 05/28/2015 1017   PROTEINUR Negative 05/28/2015 1017   UROBILINOGEN 0.2 05/27/2010 1535   NITRITE Negative 05/28/2015 1017   LEUKOCYTESUR Negative 05/28/2015 1017     Assessment & Plan:    1 Low Risk Prostate Cancer- continue  active surveillance. MR Fusion biopsy reassuring with only gleason 3 + 3 = 6 disease. Follow up in 6 months with PSA prior.  2 - Lower Urinary Tract Symptoms - stable. Would consider daily tamsulosin if bother becomes progressive based on gland size.   Return in about 6 months (around 06/03/2017) for PSA prior.  Matthew Retort, MD  [facility] [address] [place], [place] [address] [phone]

## 2016-12-24 ENCOUNTER — Ambulatory Visit (INDEPENDENT_AMBULATORY_CARE_PROVIDER_SITE_OTHER): Payer: 59 | Admitting: Pulmonary Disease

## 2016-12-24 ENCOUNTER — Encounter: Payer: Self-pay | Admitting: Pulmonary Disease

## 2016-12-24 VITALS — BP 118/80 | HR 58 | Ht 68.0 in | Wt 145.0 lb

## 2016-12-24 DIAGNOSIS — G4733 Obstructive sleep apnea (adult) (pediatric): Secondary | ICD-10-CM

## 2016-12-24 DIAGNOSIS — G4700 Insomnia, unspecified: Secondary | ICD-10-CM | POA: Diagnosis not present

## 2016-12-24 MED ORDER — TRAZODONE HCL 100 MG PO TABS: 50.0000 mg | ORAL_TABLET | Freq: Every day | ORAL | 10 refills | Status: DC

## 2016-12-24 MED ORDER — RAMELTEON 8 MG PO TABS: 8.0000 mg | ORAL_TABLET | Freq: Every evening | ORAL | 10 refills | Status: DC | PRN

## 2016-12-24 NOTE — Patient Instructions (Addendum)
Initiate ramelteon 8 mg -take 1 hour before bedtime Discontinue melatonin and trazodone Continue CPAP as previously prescribed Follow-up with Dr. Mortimer Fries in 2-3 months

## 2016-12-28 NOTE — Progress Notes (Signed)
PULMONARY/SLEEP OFFICE VISIT   CHRONIC SLEEP PROBLEMS: Insomnia Mild OSA  SUBJ: There is a routine reevaluation for patient followed by Dr. Mortimer Fries.  Initially presented with frequent early morning awakenings and underwent a sleep study which revealed mild OSA.  He is wearing CPAP as prescribed but still has frequent awakenings and believes the CPAP (AutoSet) sometimes causes him to awaken as the pressure ramps up.  When he awakens in the early morning, he has difficulty falling back asleep.  He uses Unisom, melatonin and trazodone each night.  Despite this, he continues to find it difficult to sleep through the night.  He describes minimal daytime sleepiness.    OBJ: Vitals:   12/24/16 0956 12/24/16 1000  BP:  118/80  Pulse:  (!) 58  SpO2:  98%  Weight: 65.8 kg (145 lb)   Height: 5\' 8"  (1.727 m)      Gen: WDWN in NAD HEENT: NCAT, sclerae white, oropharynx normal Neck: NO LAN, no JVD noted Lungs: full BS, no adventitious sounds Cardiovascular: Reg, no M noted Abdomen: Soft, NT, +BS Ext: no C/C/E Neuro: grossly intact   BMP Latest Ref Rng & Units 05/07/2016 11/07/2015 05/06/2015  Glucose 70 - 99 mg/dL 104(H) 136(H) 112(H)  BUN 6 - 23 mg/dL 17 16 19   Creatinine 0.40 - 1.50 mg/dL 0.95 0.80 0.87  Sodium 135 - 145 mEq/L 140 139 141  Potassium 3.5 - 5.1 mEq/L 4.5 4.0 4.3  Chloride 96 - 112 mEq/L 103 105 104  CO2 19 - 32 mEq/L 31 29 31   Calcium 8.4 - 10.5 mg/dL 9.8 8.9 9.2    CBC Latest Ref Rng & Units 05/07/2016 11/07/2015 05/06/2015  WBC 4.0 - 10.5 K/uL 3.0(L) 3.8(L) 3.4(L)  Hemoglobin 13.0 - 17.0 g/dL 14.3 13.6 14.1  Hematocrit 39.0 - 52.0 % 42.4 39.7 41.5  Platelets 150.0 - 400.0 K/uL 146.0(L) 159.0 155.0    CXR: No recent film   IMPRESSION: Mild OSA  Insomnia, unspecified type   This is a complex sleep problem.  Although his sleep study did demonstrate mild obstructive sleep apnea, his major problem is insomnia, particularly with early morning awakenings and difficulty  falling back asleep.  PLAN:   Initiate ramelteon 8 mg - take 1 hour before bedtime Discontinue melatonin and trazodone Continue CPAP as previously prescribed Follow-up with Dr. Mortimer Fries in 2-3 months - if not significantly better time, suggest evaluation by Dr. Ashby Dawes   I have reviewed this patient's medical problems, current medications and therapies and prior pulmonary office notes in evaluation and formulation of the above assessment and plan    Merton Border, MD PCCM service Mobile [phone] Pager [phone] 12/28/2016 11:00 AM

## 2017-03-02 ENCOUNTER — Encounter: Payer: Self-pay | Admitting: Family Medicine

## 2017-03-02 ENCOUNTER — Ambulatory Visit: Payer: BC Managed Care – PPO | Admitting: Family Medicine

## 2017-03-02 VITALS — BP 110/64 | HR 52 | Temp 97.5°F | Ht 68.0 in | Wt 147.0 lb

## 2017-03-02 DIAGNOSIS — M7701 Medial epicondylitis, right elbow: Secondary | ICD-10-CM | POA: Diagnosis not present

## 2017-03-02 DIAGNOSIS — M7700 Medial epicondylitis, unspecified elbow: Secondary | ICD-10-CM | POA: Insufficient documentation

## 2017-03-02 MED ORDER — MELOXICAM 15 MG PO TABS: 15.0000 mg | ORAL_TABLET | Freq: Every day | ORAL | 1 refills | Status: DC

## 2017-03-02 NOTE — Patient Instructions (Addendum)
Wear your compression band when active , also look for a forearm band (for elbow tendonitis)  Use ice as often as possible for 10-15 minutes  Relative rest - favor other arm when able Take the meloxicam daily with food  Take a look at the rehab exercises/stretches  If not improved significantly in 2 weeks let us know and I will have you see Dr Lorelei Pont (sport med)

## 2017-03-02 NOTE — Assessment & Plan Note (Signed)
After overuse in a construction project (dominant arm)  Disc relative rest  Ice 10 min whenever possible Compression /elevation  Forearm band prn  meloxicam 15 mg with food daily  Rehab exercise handout given/rev  If not imp 2 wk consider ref to sport med

## 2017-03-02 NOTE — Progress Notes (Signed)
Subjective:    Patient ID: Matthew Snyder, male    DOB: [birth year], 62 y.o.   MRN: [id number]  HPI Here for R elbow pain   Hurting since last fall  Started when building a patio (reped motion and digging/shoveling)   The pain snuck up on him slowly  Pain is medial Sharp or burning dep on position  Worst to twist = cap on bottle or doorknob / also pain when lifting up against resistance  Area of pain is getting bigger- up and down the arm No numbness or weakness No swelling  No change in grip  Some stiffness after inactivity  Now hurting in the am- this is new (until warmed up)  Wearing a slip on brace (band)  He has tried taking ibuprofen and tylenol    No upper body training right now   . Patient Active Problem List   Diagnosis Date Noted  . Medial epicondylitis 03/02/2017  . Prostate cancer ([place]) 04/17/2016  . Urinary urgency 05/28/2015  . Hyperglycemia 05/10/2015  . Elevated PSA 05/10/2015  . Dysplastic toenail 09/10/2014  . Routine general medical examination at a health care facility 09/27/2011  . History of retinal detachment 05/30/2010  . Other general medical examination for administrative purposes 05/27/2010  . ASTHMA 07/31/2009   Past Medical History:  Diagnosis Date  . Allergic rhinitis   . Anemia    mild, from blood transfusion  . Asthma   . Retinal detachment    Past Surgical History:  Procedure Laterality Date  . CATARACT EXTRACTION  2010   left eye  . RETINAL DETACHMENT SURGERY    . TONSILLECTOMY     child   Social History   Tobacco Use  . Smoking status: Never Smoker  . Smokeless tobacco: Never Used  Substance Use Topics  . Alcohol use: Yes    Alcohol/week: 0.0 oz    Comment: occ  . Drug use: No   Family History  Problem Relation Age of Onset  . Aneurysm Sister        brain  . Tracheal cancer Brother        lung, smoker  . Prostate cancer Brother   . Renal cancer Neg Hx   . Bladder Cancer Neg Hx    No Known  Allergies Current Outpatient Medications on File Prior to Visit  Medication Sig Dispense Refill  . aspirin 81 MG tablet Take 81 mg by mouth daily. Reported on 06/28/2015    . doxylamine, Sleep, (UNISOM) 25 MG tablet Take 25 mg by mouth at bedtime as needed.     . Fluticasone-Salmeterol (ADVAIR DISKUS) 100-50 MCG/DOSE AEPB Inhale 1 puff into the lungs 2 (two) times daily. 3 each 3  . IRON, FERROUS SULFATE, PO Take by mouth.    . Melatonin 5 MG CAPS Take 1 capsule by mouth at bedtime.     No current facility-administered medications on file prior to visit.      Review of Systems  Constitutional: Negative for activity change, appetite change, fatigue, fever and unexpected weight change.  HENT: Negative for congestion, rhinorrhea, sore throat and trouble swallowing.   Eyes: Negative for pain, redness, itching and visual disturbance.  Respiratory: Negative for cough, chest tightness, shortness of breath and wheezing.   Cardiovascular: Negative for chest pain and palpitations.  Gastrointestinal: Negative for abdominal pain, blood in stool, constipation, diarrhea and nausea.  Endocrine: Negative for cold intolerance, heat intolerance, polydipsia and polyuria.  Genitourinary: Negative for difficulty urinating, dysuria, frequency and  urgency.  Musculoskeletal: Negative for arthralgias, joint swelling and myalgias.       Pos for R elbow pain   Skin: Negative for pallor and rash.  Neurological: Negative for dizziness, tremors, weakness, numbness and headaches.  Hematological: Negative for adenopathy. Does not bruise/bleed easily.  Psychiatric/Behavioral: Negative for decreased concentration and dysphoric mood. The patient is not nervous/anxious.        Objective:   Physical Exam  Constitutional: He appears well-developed and well-nourished. No distress.  HENT:  Head: Normocephalic and atraumatic.  Mouth/Throat: Oropharynx is clear and moist.  Eyes: Conjunctivae and EOM are normal. Pupils are  equal, round, and reactive to light.  Neck: Normal range of motion. Neck supple.  Musculoskeletal:       Right elbow: He exhibits normal range of motion, no swelling, no effusion and no deformity. Tenderness found. Medial epicondyle tenderness noted.  R elbow-nl rom  Pain with resisted flexion/palm up  Pain with supination/pronation Tender over medial epicondyle  No neuro changes -nl strength    Neurological: He is alert. He has normal reflexes. He displays no atrophy. No sensory deficit. He exhibits normal muscle tone.  Skin: Skin is warm and dry. No rash noted. No erythema.  Psychiatric: He has a normal mood and affect.          Assessment & Plan:   Problem List Items Addressed This Visit      Musculoskeletal and Integument   Medial epicondylitis    After overuse in a construction project (dominant arm)  Disc relative rest  Ice 10 min whenever possible Compression /elevation  Forearm band prn  meloxicam 15 mg with food daily  Rehab exercise handout given/rev  If not imp 2 wk consider ref to sport med

## 2017-03-26 DIAGNOSIS — M25511 Pain in right shoulder: Secondary | ICD-10-CM | POA: Insufficient documentation

## 2017-05-09 ENCOUNTER — Telehealth: Payer: Self-pay | Admitting: Family Medicine

## 2017-05-09 DIAGNOSIS — R739 Hyperglycemia, unspecified: Secondary | ICD-10-CM

## 2017-05-09 DIAGNOSIS — Z0000 Encounter for general adult medical examination without abnormal findings: Secondary | ICD-10-CM

## 2017-05-09 NOTE — Telephone Encounter (Signed)
-----   Message from Ellamae Sia sent at 05/05/2017 10:29 AM EDT ----- Regarding: Lab orders for Tuesday, 3.19.19 Patient is scheduled for CPX labs, please order future labs, Thanks , Karna Christmas

## 2017-05-11 ENCOUNTER — Other Ambulatory Visit (INDEPENDENT_AMBULATORY_CARE_PROVIDER_SITE_OTHER): Payer: BC Managed Care – PPO

## 2017-05-11 DIAGNOSIS — R739 Hyperglycemia, unspecified: Secondary | ICD-10-CM

## 2017-05-11 DIAGNOSIS — Z0000 Encounter for general adult medical examination without abnormal findings: Secondary | ICD-10-CM

## 2017-05-11 LAB — CBC WITH DIFFERENTIAL/PLATELET
BASOS PCT: 1.2 % (ref 0.0–3.0)
Basophils Absolute: 0 10*3/uL (ref 0.0–0.1)
Eosinophils Absolute: 0.2 10*3/uL (ref 0.0–0.7)
Eosinophils Relative: 4.6 % (ref 0.0–5.0)
HEMATOCRIT: 41.3 % (ref 39.0–52.0)
HEMOGLOBIN: 14.1 g/dL (ref 13.0–17.0)
LYMPHS PCT: 44.3 % (ref 12.0–46.0)
Lymphs Abs: 1.5 10*3/uL (ref 0.7–4.0)
MCHC: 34.2 g/dL (ref 30.0–36.0)
MCV: 87.6 fl (ref 78.0–100.0)
MONO ABS: 0.3 10*3/uL (ref 0.1–1.0)
Monocytes Relative: 8 % (ref 3.0–12.0)
Neutro Abs: 1.4 10*3/uL (ref 1.4–7.7)
Neutrophils Relative %: 41.9 % — ABNORMAL LOW (ref 43.0–77.0)
Platelets: 132 10*3/uL — ABNORMAL LOW (ref 150.0–400.0)
RBC: 4.71 Mil/uL (ref 4.22–5.81)
RDW: 14.5 % (ref 11.5–15.5)
WBC: 3.3 10*3/uL — AB (ref 4.0–10.5)

## 2017-05-11 LAB — COMPREHENSIVE METABOLIC PANEL
ALBUMIN: 4.2 g/dL (ref 3.5–5.2)
ALK PHOS: 30 U/L — AB (ref 39–117)
ALT: 26 U/L (ref 0–53)
AST: 28 U/L (ref 0–37)
BUN: 18 mg/dL (ref 6–23)
CALCIUM: 9.2 mg/dL (ref 8.4–10.5)
CHLORIDE: 104 meq/L (ref 96–112)
CO2: 32 mEq/L (ref 19–32)
Creatinine, Ser: 0.92 mg/dL (ref 0.40–1.50)
GFR: 89.33 mL/min (ref >60.00)
Glucose, Bld: 104 mg/dL — ABNORMAL HIGH (ref 70–99)
POTASSIUM: 4.2 meq/L (ref 3.5–5.1)
SODIUM: 141 meq/L (ref 135–145)
TOTAL PROTEIN: 6.3 g/dL (ref 6.0–8.3)
Total Bilirubin: 0.5 mg/dL (ref 0.2–1.2)

## 2017-05-11 LAB — HEMOGLOBIN A1C: Hgb A1c MFr Bld: 5.9 % (ref 4.6–6.5)

## 2017-05-11 LAB — LIPID PANEL
Cholesterol: 165 mg/dL (ref 0–200)
HDL: 74.6 mg/dL (ref >39.00)
LDL CALC: 82 mg/dL (ref 0–99)
NonHDL: 89.96
TRIGLYCERIDES: 38 mg/dL (ref 0.0–149.0)
Total CHOL/HDL Ratio: 2
VLDL: 7.6 mg/dL (ref 0.0–40.0)

## 2017-05-11 LAB — TSH: TSH: 2.45 u[IU]/mL (ref 0.35–4.50)

## 2017-05-14 ENCOUNTER — Encounter: Payer: 59 | Admitting: Family Medicine

## 2017-05-21 ENCOUNTER — Encounter: Payer: Self-pay | Admitting: Family Medicine

## 2017-05-21 ENCOUNTER — Ambulatory Visit (INDEPENDENT_AMBULATORY_CARE_PROVIDER_SITE_OTHER): Payer: BC Managed Care – PPO | Admitting: Family Medicine

## 2017-05-21 VITALS — BP 110/66 | HR 49 | Temp 97.8°F | Ht 68.0 in | Wt 138.5 lb

## 2017-05-21 DIAGNOSIS — C61 Malignant neoplasm of prostate: Secondary | ICD-10-CM | POA: Diagnosis not present

## 2017-05-21 DIAGNOSIS — Z0000 Encounter for general adult medical examination without abnormal findings: Secondary | ICD-10-CM

## 2017-05-21 DIAGNOSIS — M7701 Medial epicondylitis, right elbow: Secondary | ICD-10-CM

## 2017-05-21 DIAGNOSIS — R739 Hyperglycemia, unspecified: Secondary | ICD-10-CM | POA: Diagnosis not present

## 2017-05-21 MED ORDER — FLUTICASONE-SALMETEROL 100-50 MCG/DOSE IN AEPB: 1.0000 | INHALATION_SPRAY | Freq: Two times a day (BID) | RESPIRATORY_TRACT | 3 refills | Status: DC

## 2017-05-21 NOTE — Progress Notes (Signed)
Subjective:    Patient ID: Matthew Snyder, male    DOB: 1955/06/28, 62 y.o.   MRN: [id number]  HPI Here for health maintenance exam and to review chronic medical problems    Feeling good   Having issues with his R shoulder - a lot of pain and limited rom  Posterior and lateral  Trouble extending it  On and off for a long time (used to give him n/t in hand) -but it has changed  Still elbow pain only with lifting heavy   Still having trouble staying asleep  Used some herbal supplements  unisom works   Has tried trazadone  Has not tried doxepin   Avoids caffeine after 11 am    Wt Readings from Last 3 Encounters:  05/21/17 138 lb 8 oz (62.8 kg)  03/02/17 147 lb (66.7 kg)  12/24/16 145 lb (65.8 kg)  he is training for 1/2 marathon - 1 week and another in 3 weeks  Also cut out sugar  Trying to get more fat and protein  21.06 kg/m   Flu vaccine 10/18  Tetanus shot 4/09  Colonoscopy 8/10- 10y recall   Zoster status -has not had shingles or shingle vaccine    Prostate health/personal hx of prostate cancer  Urology f/u October Had MR fusion biopsy after psa increased to 4.6 Went up to low risk status and has continued f/u  Has psa in April    Brother also had prostate cancer    Blood pressure BP Readings from Last 3 Encounters:  05/21/17 110/66  03/02/17 110/64  12/24/16 118/80   Pulse Readings from Last 3 Encounters:  05/21/17 (!) 49  03/02/17 (!) 52  12/24/16 (!) 58  no palpitations  Does not get dizzy    Hyperglycemia Lab Results  Component Value Date   HGBA1C 5.9 05/11/2017  down from 6.0  CBC Lab Results  Component Value Date   WBC 3.3 (L) 05/11/2017   HGB 14.1 05/11/2017   HCT 41.3 05/11/2017   MCV 87.6 05/11/2017   PLT 132.0 (L) 05/11/2017   wbc usually runs in 3 range  Up from 3.0 Platelets fairly stable    Cholesterol Lab Results  Component Value Date   CHOL 165 05/11/2017   CHOL 182 05/07/2016   CHOL 164 11/07/2015   Lab  Results  Component Value Date   HDL 74.60 05/11/2017   HDL 78.50 05/07/2016   HDL 70.30 11/07/2015   Lab Results  Component Value Date   LDLCALC 82 05/11/2017   LDLCALC 98 05/07/2016   LDLCALC 87 11/07/2015   Lab Results  Component Value Date   TRIG 38.0 05/11/2017   TRIG 27.0 05/07/2016   TRIG 31.0 11/07/2015   Lab Results  Component Value Date   CHOLHDL 2 05/11/2017   CHOLHDL 2 05/07/2016   CHOLHDL 2 11/07/2015   No results found for: LDLDIRECT Very good profile   Patient Active Problem List   Diagnosis Date Noted  . Medial epicondylitis 03/02/2017  . Prostate cancer ([place]) 04/17/2016  . Urinary urgency 05/28/2015  . Hyperglycemia 05/10/2015  . Elevated PSA 05/10/2015  . Dysplastic toenail 09/10/2014  . Routine general medical examination at a health care facility 09/27/2011  . History of retinal detachment 05/30/2010  . Other general medical examination for administrative purposes 05/27/2010  . ASTHMA 07/31/2009   Past Medical History:  Diagnosis Date  . Allergic rhinitis   . Anemia    mild, from blood transfusion  . Asthma   .  Retinal detachment    Past Surgical History:  Procedure Laterality Date  . CATARACT EXTRACTION  2010   left eye  . RETINAL DETACHMENT SURGERY    . TONSILLECTOMY     child   Social History   Tobacco Use  . Smoking status: Never Smoker  . Smokeless tobacco: Never Used  Substance Use Topics  . Alcohol use: Yes    Alcohol/week: 0.0 oz    Comment: occ  . Drug use: No   Family History  Problem Relation Age of Onset  . Aneurysm Sister        brain  . Tracheal cancer Brother        lung, smoker  . Prostate cancer Brother   . Renal cancer Neg Hx   . Bladder Cancer Neg Hx    No Known Allergies Current Outpatient Medications on File Prior to Visit  Medication Sig Dispense Refill  . aspirin 81 MG tablet Take 81 mg by mouth daily. Reported on 06/28/2015    . doxylamine, Sleep, (UNISOM) 25 MG tablet Take 25 mg by mouth at  bedtime as needed.     . IRON, FERROUS SULFATE, PO Take by mouth.    . Melatonin 5 MG CAPS Take 1 capsule by mouth at bedtime.     No current facility-administered medications on file prior to visit.     Review of Systems  Constitutional: Negative for activity change, appetite change, fatigue, fever and unexpected weight change.  HENT: Negative for congestion, rhinorrhea, sore throat and trouble swallowing.   Eyes: Negative for pain, redness, itching and visual disturbance.  Respiratory: Negative for cough, chest tightness, shortness of breath and wheezing.   Cardiovascular: Negative for chest pain and palpitations.  Gastrointestinal: Negative for abdominal pain, blood in stool, constipation, diarrhea and nausea.  Endocrine: Negative for cold intolerance, heat intolerance, polydipsia and polyuria.  Genitourinary: Negative for difficulty urinating, dysuria, frequency and urgency.  Musculoskeletal: Positive for arthralgias. Negative for joint swelling and myalgias.       Right elbow and shoulder pain    Skin: Negative for pallor and rash.  Neurological: Negative for dizziness, tremors, weakness, numbness and headaches.  Hematological: Negative for adenopathy. Does not bruise/bleed easily.  Psychiatric/Behavioral: Positive for sleep disturbance. Negative for decreased concentration and dysphoric mood. The patient is not nervous/anxious.        Objective:   Physical Exam  Constitutional: He appears well-developed and well-nourished. No distress.  Slim and well appearing   HENT:  Head: Normocephalic and atraumatic.  Right Ear: External ear normal.  Left Ear: External ear normal.  Nose: Nose normal.  Mouth/Throat: Oropharynx is clear and moist.  Eyes: Pupils are equal, round, and reactive to light. Conjunctivae and EOM are normal. Right eye exhibits no discharge. Left eye exhibits no discharge. No scleral icterus.  Neck: Normal range of motion. Neck supple. No JVD present. Carotid bruit  is not present. No thyromegaly present.  Cardiovascular: Normal rate, regular rhythm, normal heart sounds and intact distal pulses. Exam reveals no gallop.  Pulmonary/Chest: Effort normal and breath sounds normal. No respiratory distress. He has no wheezes. He exhibits no tenderness.  Abdominal: Soft. Bowel sounds are normal. He exhibits no distension, no abdominal bruit and no mass. There is no tenderness.  Musculoskeletal: He exhibits no edema or tenderness.  Limited ext of R arm- with apprehension  Some pain on Hawking test  Lymphadenopathy:    He has no cervical adenopathy.  Neurological: He is alert. He has normal reflexes.  No cranial nerve deficit. He exhibits normal muscle tone. Coordination normal.  Skin: Skin is warm and dry. No rash noted. No erythema. No pallor.  Many brown nevi on trunk/back (pt sees dermatology for skin checks)  Great toenails are discolored  Psychiatric: He has a normal mood and affect.  Pleasant / mood is good          Assessment & Plan:   Problem List Items Addressed This Visit      Musculoskeletal and Integument   Medial epicondylitis    Improved but not resolved/R side  F/u with Dr Lorelei Pont        Genitourinary   Prostate cancer [facility])    Close f/u with urology-rev last MR/ bx Not symptomatic currently        Other   Hyperglycemia - Primary    Lab Results  Component Value Date   HGBA1C 5.9 05/11/2017   Continue to watch  Very good diet and exercise habits      Routine general medical examination at a health care facility    Reviewed health habits including diet and exercise and skin cancer prevention Reviewed appropriate screening tests for age  Also reviewed health mt list, fam hx and immunization status , as well as social and family history   Labs reviewed  Disc strategies for sleep problems  F/u with Dr Lorelei Pont for R arm problems  Enc good calorie intake to keep up with athletic requirements/marathon running Continue f/u  with urology for prostate cancer

## 2017-05-21 NOTE — Patient Instructions (Addendum)
There is a free app called "smiling mind" - that introduces you to meditation and mindfulness   Avoid caffeine as much as possible   If you are interested in the new shingles vaccine (Shingrix) - call your local pharmacy to check on coverage and availability  If affordable put yourself on a waiting list   We will set you up with Dr Lorelei Pont for the arm/shoulder issue   I printed some piriformis rehab info

## 2017-05-23 NOTE — Assessment & Plan Note (Signed)
Close f/u with urology-rev last MR/ bx Not symptomatic currently

## 2017-05-23 NOTE — Assessment & Plan Note (Addendum)
Reviewed health habits including diet and exercise and skin cancer prevention Reviewed appropriate screening tests for age  Also reviewed health mt list, fam hx and immunization status , as well as social and family history   Labs reviewed  Disc strategies for sleep problems  F/u with Dr Lorelei Pont for R arm problems  Enc good calorie intake to keep up with athletic requirements/marathon running Continue f/u with urology for prostate cancer

## 2017-05-23 NOTE — Assessment & Plan Note (Signed)
Improved but not resolved/R side  F/u with Dr Lorelei Pont

## 2017-05-23 NOTE — Assessment & Plan Note (Signed)
Lab Results  Component Value Date   HGBA1C 5.9 05/11/2017   Continue to watch  Very good diet and exercise habits

## 2017-06-01 ENCOUNTER — Other Ambulatory Visit: Payer: BC Managed Care – PPO

## 2017-06-01 DIAGNOSIS — C61 Malignant neoplasm of prostate: Secondary | ICD-10-CM

## 2017-06-02 ENCOUNTER — Encounter: Payer: Self-pay | Admitting: Family Medicine

## 2017-06-02 ENCOUNTER — Encounter: Payer: Self-pay | Admitting: *Deleted

## 2017-06-02 ENCOUNTER — Other Ambulatory Visit: Payer: Self-pay

## 2017-06-02 ENCOUNTER — Ambulatory Visit (INDEPENDENT_AMBULATORY_CARE_PROVIDER_SITE_OTHER)
Admission: RE | Admit: 2017-06-02 | Discharge: 2017-06-02 | Disposition: A | Discharge status: Home / Self Care | Payer: BC Managed Care – PPO | Source: Ambulatory Visit | Attending: Family Medicine | Admitting: Family Medicine

## 2017-06-02 ENCOUNTER — Telehealth: Payer: Self-pay

## 2017-06-02 ENCOUNTER — Ambulatory Visit: Payer: BC Managed Care – PPO | Admitting: Family Medicine

## 2017-06-02 VITALS — BP 120/80 | HR 53 | Temp 97.7°F | Ht 68.0 in | Wt 145.5 lb

## 2017-06-02 DIAGNOSIS — M25511 Pain in right shoulder: Secondary | ICD-10-CM

## 2017-06-02 DIAGNOSIS — M7701 Medial epicondylitis, right elbow: Secondary | ICD-10-CM | POA: Diagnosis not present

## 2017-06-02 DIAGNOSIS — M7521 Bicipital tendinitis, right shoulder: Secondary | ICD-10-CM

## 2017-06-02 DIAGNOSIS — G2589 Other specified extrapyramidal and movement disorders: Secondary | ICD-10-CM

## 2017-06-02 DIAGNOSIS — G8929 Other chronic pain: Secondary | ICD-10-CM | POA: Diagnosis not present

## 2017-06-02 LAB — PSA: PROSTATE SPECIFIC AG, SERUM: 3.1 ng/mL (ref 0.0–4.0)

## 2017-06-02 MED ORDER — PREDNISONE 20 MG PO TABS: ORAL_TABLET | ORAL | 0 refills | Status: DC

## 2017-06-02 NOTE — Progress Notes (Signed)
Dr. Frederico Hamman T. Janann Boeve, MD, [facility] [place], [address] Phone: [phone] Fax: [phone]  06/02/2017  Patient: Matthew Snyder, MRN: [id number], DOB: 1955/12/07, 62 y.o.  Primary Physician:  Tower, Wynelle Fanny, MD   Chief Complaint  Patient presents with  . Medial Epicondylitis    Right   Subjective:   Matthew Snyder is a 62 y.o. very pleasant male patient who presents with the following:  The patient presents today referred by Dr. Glori Bickers with multiple orthopedic complaints.  He has had some right-sided shoulder pain ongoing for about 3 or 4 years.  He has a dull ache in the shoulder as well as pain with some abduction and with some rotational movements.  Previously was having some numbness and tingling down his arm, but that has improved over time.  He also has right-sided elbow pain, predominantly in the medial aspect and to a lesser aspect in the lateral epicondylar region.  This is been going on for about 5 months or so.  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   Saw Dr. Laveda Abbe - worked on his neck. Was having some numbness in the hand.   ME.  Scapular winging Biceps tendon  Past Medical History, Surgical History, Social History, Family History, Problem List, Medications, and Allergies have been reviewed and updated if relevant.  Patient Active Problem List   Diagnosis Date Noted  . Shoulder pain, right 03/26/2017  . Medial epicondylitis 03/02/2017  . Prostate cancer ([place]) 04/17/2016  . Hyperglycemia 05/10/2015  . Elevated PSA 05/10/2015  . Routine general medical examination at a health care facility 09/27/2011  . History of retinal detachment 05/30/2010  . Other general medical examination for administrative purposes 05/27/2010  . ASTHMA 07/31/2009    Past Medical History:  Diagnosis Date  . Allergic rhinitis   .  Anemia    mild, from blood transfusion  . Asthma   . Retinal detachment     Past Surgical History:  Procedure Laterality Date  . CATARACT EXTRACTION  2010   left eye  . RETINAL DETACHMENT SURGERY    . TONSILLECTOMY     child    Social History   Socioeconomic History  . Marital status: Married    Spouse name: Not on file  . Number of children: 1  . Years of education: Not on file  . Highest education level: Not on file  Occupational History  . 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 Financial resource strain: Not on file  . Food insecurity:    Worry: Not on file    Inability: Not on file  . Transportation needs:    Medical: Not on file    Non-medical: Not on file  Tobacco Use  . Smoking status: Never Smoker  . Smokeless tobacco: Never Used  Substance and Sexual Activity  . Alcohol use: Yes    Alcohol/week: 0.0 oz    Comment: occ  . Drug use: No  . Sexual activity: Not on file  Lifestyle  . Physical activity:    Days per week: Not on file    Minutes per session: Not on file  . Stress: Not on file  Relationships  . Social connections:    Talks on phone: Not on file    Gets together: Not on file    Attends religious service: Not  on file    Active member of club or organization: Not on file    Attends meetings of clubs or organizations: Not on file    Relationship status: Not on file  . Intimate partner violence:    Fear of current or ex partner: Not on file    Emotionally abused: Not on file    Physically abused: Not on file    Forced sexual activity: Not on file  Other Topics Concern  . Not on file  Social History Narrative   Regular exercise.--runs a lot    Runs 1/2 marathons     Family History  Problem Relation Age of Onset  . Aneurysm Sister        brain  . Tracheal cancer Brother        lung, smoker  . Prostate cancer Brother   . Renal cancer Neg Hx   . Bladder Cancer Neg Hx     No Known Allergies  Medication list reviewed and updated  in full in [facility].  GEN: No fevers, chills. Nontoxic. Primarily MSK c/o today. MSK: Detailed in the HPI GI: tolerating PO intake without difficulty Neuro: No numbness, parasthesias, or tingling associated. Otherwise the pertinent positives of the ROS are noted above.   Objective:   BP 120/80   Pulse (!) 53   Temp 97.7 F (36.5 C) (Oral)   Ht 5\' 8"  (1.727 m)   Wt 145 lb 8 oz (66 kg)   BMI 22.12 kg/m    GEN: Well-developed,well-nourished,in no acute distress; alert,appropriate and cooperative throughout examination HEENT: Normocephalic and atraumatic without obvious abnormalities. Ears, externally no deformities PULM: Breathing comfortably in no respiratory distress EXT: No clubbing, cyanosis, or edema PSYCH: Normally interactive. Cooperative during the interview. Pleasant. Friendly and conversant. Not anxious or depressed appearing. Normal, full affect.  Shoulder: R Inspection: Notable R scapular winging with push-up Ecchymosis/edema: neg  AC joint, scapula, clavicle: NT Cervical spine: NT, full ROM Spurling's: neg Abduction: full, 5/5 Flexion: full, 5/5 IR, full, lift-off: 5/5 ER at neutral: full, 5/5 AC crossover: mild pos Neer: mild pos Hawkins: mild pos Drop Test: neg Empty Can: neg Supraspinatus insertion: mild-mod T Bicipital groove: NT Speed's: mild pos Yergason's: neg Sulcus sign: ne C5-T1 intact  Elbow: R Ecchymosis or edema: neg ROM: full flexion, extension, pronation, supination Shoulder ROM: Full Flexion: 5/5 Extension: 5/5 Supination: 5/5  Pronation: 4+/5 Wrist ext: 5/5 Wrist flexion: 4+/5 No gross bony abnormality Varus and Valgus stress: stable ECRB tenderness: neg Medial epicondyle: tender at flexor tendon insertion Lateral epicondyle, resisted wrist extension from wrist full pronation and flexion: minimally tender Resisted pronation: very tender grip: 5/5  sensation intact   Radiology: Dg Shoulder Right  Result Date:  06/02/2017 CLINICAL DATA:  Right shoulder pain for 3-4 years.  No known injury. EXAM: RIGHT SHOULDER - 2+ VIEW COMPARISON:  None. FINDINGS: There is no evidence of fracture or dislocation. There is no evidence of arthropathy or other focal bone abnormality. Soft tissues are unremarkable. IMPRESSION: Negative exam. Electronically Signed   By: Inge Rise M.D.   On: 06/02/2017 15:56   Assessment and Plan:   Chronic right shoulder pain - Plan: DG Shoulder Right, Ambulatory referral to Physical Therapy  Medial epicondylitis, right - Plan: Ambulatory referral to Physical Therapy  Biceps tendonitis, right - Plan: Ambulatory referral to Physical Therapy  Scapular dyskinesis - Plan: Ambulatory referral to Physical Therapy  Multiple ongoing issues, classic medial epicondylitis and to a much lesser extent lateral epicondylitis.  4 years of shoulder pain with some biceps tendinitis, mild impingement, as well as some relatively significant scapular dyskinesis.  I think that this will likely get better with some PT and home rehab, which I reviewed with him.   Potential: Good.  With so many joints involved, think that some oral steroids may be a reasonable choice here.  I will see him back at the end of May to reassess.  Follow-up: Return in about 6 weeks (around 07/14/2017).  Meds ordered this encounter  Medications  . predniSONE (DELTASONE) 20 MG tablet    Sig: 2 tabs po for 5 days, then 1 tab po for 5 days    Dispense:  15 tablet    Refill:  0   Orders Placed This Encounter  Procedures  . DG Shoulder Right  . Ambulatory referral to Physical Therapy    Signed,  Frederico Hamman T. Esmirna Ravan, MD   Allergies as of 06/02/2017   No Known Allergies     Medication List        Accurate as of 06/02/17 11:59 PM. Always use your most recent med list.          aspirin 81 MG tablet Take 81 mg by mouth daily. Reported on 06/28/2015   doxylamine (Sleep) 25 MG tablet Commonly known as:   UNISOM Take 25 mg by mouth at bedtime as needed.   Fluticasone-Salmeterol 100-50 MCG/DOSE Aepb Commonly known as:  ADVAIR DISKUS Inhale 1 puff into the lungs 2 (two) times daily.   IRON (FERROUS SULFATE) PO Take by mouth.   Melatonin 5 MG Caps Take 1 capsule by mouth at bedtime.   predniSONE 20 MG tablet Commonly known as:  DELTASONE 2 tabs po for 5 days, then 1 tab po for 5 days

## 2017-06-02 NOTE — Telephone Encounter (Signed)
Left message for patient to call Brendaly Townsel back in regards to a referral-Marios Gaiser V Darelle Kings, RMA   

## 2017-06-03 ENCOUNTER — Ambulatory Visit: Payer: 59

## 2017-06-18 ENCOUNTER — Ambulatory Visit: Payer: 59

## 2017-07-08 ENCOUNTER — Encounter: Payer: Self-pay | Admitting: Urology

## 2017-07-08 ENCOUNTER — Ambulatory Visit: Payer: BC Managed Care – PPO | Admitting: Urology

## 2017-07-08 VITALS — BP 123/76 | HR 53 | Ht 68.0 in | Wt 146.1 lb

## 2017-07-08 DIAGNOSIS — C61 Malignant neoplasm of prostate: Secondary | ICD-10-CM | POA: Diagnosis not present

## 2017-07-08 NOTE — Progress Notes (Signed)
07/08/2017 10:07 AM   Matthew Snyder 1955-10-11 [id number]  Referring provider: Abner Greenspan, MD [address] [place], [place] [address]  Chief Complaint  Patient presents with  . Prostate Cancer    HPI: The patient is a 62 year old gentleman who presents today for follow-up of his active surveillance for his low risk prostate cancer.  1 - Low Risk Prostate Cancer- on surveillance  05/2015 -1 core of Gleason 3+3 = 6 prostate cancer, 5% right lateral apex, TRUS 69mL on eval PSA rise to 3.7 ==> surveillance 09/2015 - re-BX no cancer, PSA 2.89 03/2016 - DRE 40 gm w/ mild asymmetry, no nodules / PSA 2.3 09/2016 - PSA 4.6 10/2016 - MRI of his prostate performed due to rising PSA showed a 1.4 cm PI-RADS 4 lesionlesion in the lateral left mid gland 11/2016 - MR fusion biopsy 5 cores of Gleason 3 + 3  = 6 - LLM (ROI), LLA, LLA, RLA, RBL 5-20%. TRUS 42 mL 05/2017 -PSA 3.1  2 - Lower Urinary Tract Symptoms - slowly progressive bother from mostly irritative voiding symptoms with urgency, double void. TRUS 43gm. PVR 2017 "5mL". Has elected observation.   PMH sig for TNA, Lt eye surgery. No CV disease. No blood thinners. He is runs for fitness and is an Therapist, sports at assisted living facility in [place] His PCP is Jacqulynn Cadet MD with [facility].  No interval urinary retention, bone pain or, gross hematuria.  He has had no concerns at this time and presents solely for prostate cancer follow-up.  PMH: Past Medical History:  Diagnosis Date  . Allergic rhinitis   . Anemia    mild, from blood transfusion  . Asthma   . Retinal detachment     Surgical History: Past Surgical History:  Procedure Laterality Date  . CATARACT EXTRACTION  2010   left eye  . RETINAL DETACHMENT SURGERY    . TONSILLECTOMY     child    Home Medications:  Allergies as of 07/08/2017   No Known Allergies     Medication List        Accurate as of 07/08/17 10:07 AM. Always use your most recent med  list.          aspirin 81 MG tablet Take 81 mg by mouth daily. Reported on 06/28/2015   doxylamine (Sleep) 25 MG tablet Commonly known as:  UNISOM Take 25 mg by mouth at bedtime as needed.   Fluticasone-Salmeterol 100-50 MCG/DOSE Aepb Commonly known as:  ADVAIR DISKUS Inhale 1 puff into the lungs 2 (two) times daily.   IRON (FERROUS SULFATE) PO Take by mouth.   Melatonin 5 MG Caps Take 1 capsule by mouth at bedtime.       Allergies: No Known Allergies  Family History: Family History  Problem Relation Age of Onset  . Aneurysm Sister        brain  . Tracheal cancer Brother        lung, smoker  . Prostate cancer Brother   . Renal cancer Neg Hx   . Bladder Cancer Neg Hx     Social History:  reports that he has never smoked. He has never used smokeless tobacco. He reports that he drinks alcohol. He reports that he does not use drugs.  ROS: UROLOGY Frequent Urination?: No Hard to postpone urination?: No Burning/pain with urination?: No Get up at night to urinate?: No Leakage of urine?: No Urine stream starts and stops?: No Trouble starting stream?: No Do you  have to strain to urinate?: No Blood in urine?: No Urinary tract infection?: No Sexually transmitted disease?: No Injury to kidneys or bladder?: No Painful intercourse?: No Weak stream?: No Erection problems?: No Penile pain?: No  Gastrointestinal Nausea?: No Vomiting?: No Indigestion/heartburn?: No Diarrhea?: No Constipation?: No  Constitutional Fever: No Night sweats?: No Weight loss?: No Fatigue?: No  Skin Skin rash/lesions?: No Itching?: No  Eyes Blurred vision?: No Double vision?: No  Ears/Nose/Throat Sore throat?: No Sinus problems?: No  Hematologic/Lymphatic Swollen glands?: No Easy bruising?: No  Cardiovascular Leg swelling?: No Chest pain?: No  Respiratory Cough?: No Shortness of breath?: No  Endocrine Excessive thirst?: No  Musculoskeletal Back pain?: No Joint  pain?: No  Neurological Headaches?: No Dizziness?: No  Psychologic Depression?: No Anxiety?: No  Physical Exam: BP 123/76 (BP Location: Right Arm, Patient Position: Sitting, Cuff Size: Normal)   Pulse (!) 53   Ht 5\' 8"  (1.727 m)   Wt 146 lb 1.6 oz (66.3 kg)   BMI 22.21 kg/m   Constitutional:  Alert and oriented, No acute distress. HEENT: [place] AT, moist mucus membranes.  Trachea midline, no masses. Cardiovascular: No clubbing, cyanosis, or edema. Respiratory: Normal respiratory effort, no increased work of breathing. GI: Abdomen is soft, nontender, nondistended, no abdominal masses GU: No CVA tenderness.  Skin: No rashes, bruises or suspicious lesions. Lymph: No cervical or inguinal adenopathy. Neurologic: Grossly intact, no focal deficits, moving all 4 extremities. Psychiatric: Normal mood and affect.  Laboratory Data: Lab Results  Component Value Date   WBC 3.3 (L) 05/11/2017   HGB 14.1 05/11/2017   HCT 41.3 05/11/2017   MCV 87.6 05/11/2017   PLT 132.0 (L) 05/11/2017    Lab Results  Component Value Date   CREATININE 0.92 05/11/2017    Lab Results  Component Value Date   PSA 2.84 11/07/2015   PSA 3.73 05/06/2015   PSA 2.17 05/04/2014    No results found for: TESTOSTERONE  Lab Results  Component Value Date   HGBA1C 5.9 05/11/2017    Urinalysis    Component Value Date/Time   APPEARANCEUR Clear 05/28/2015 1017   GLUCOSEU Negative 05/28/2015 1017   BILIRUBINUR Negative 05/28/2015 1017   PROTEINUR Negative 05/28/2015 1017   UROBILINOGEN 0.2 05/27/2010 1535   NITRITE Negative 05/28/2015 1017   LEUKOCYTESUR Negative 05/28/2015 1017    Assessment & Plan:    1 Low Risk Prostate Cancer- continue active surveillance. MR Fusion biopsy reassuring with only gleason 3 + 3 = 6 disease as well as decreasing PSA 6 months after biopsy. Follow up for DRE in 6 months with PSA prior.  2 - Lower Urinary Tract Symptoms - stable. Would consider daily tamsulosin if  bother becomes progressive based on gland size.   Return in about 6 months (around 01/08/2018) for PSA prior.  Nickie Retort, MD  [facility] [address] [place], [place] [address] [phone]

## 2017-07-14 ENCOUNTER — Ambulatory Visit: Payer: BC Managed Care – PPO | Admitting: Family Medicine

## 2017-07-14 ENCOUNTER — Encounter: Payer: Self-pay | Admitting: Family Medicine

## 2017-07-14 VITALS — BP 90/60 | HR 53 | Temp 97.8°F | Ht 68.0 in | Wt 143.0 lb

## 2017-07-14 DIAGNOSIS — G2589 Other specified extrapyramidal and movement disorders: Secondary | ICD-10-CM | POA: Diagnosis not present

## 2017-07-14 DIAGNOSIS — M7701 Medial epicondylitis, right elbow: Secondary | ICD-10-CM

## 2017-07-14 DIAGNOSIS — G8929 Other chronic pain: Secondary | ICD-10-CM

## 2017-07-14 DIAGNOSIS — M25511 Pain in right shoulder: Secondary | ICD-10-CM

## 2017-07-14 DIAGNOSIS — M7521 Bicipital tendinitis, right shoulder: Secondary | ICD-10-CM

## 2017-07-14 NOTE — Progress Notes (Signed)
Dr. Frederico Hamman T. Gissela Bloch, MD, [facility] [place], [address] Phone: [phone] Fax: [phone]  07/14/2017  Patient: Matthew Snyder, MRN: [id number], DOB: Jul 18, 1955, 62 y.o.  Primary Physician:  Tower, Wynelle Fanny, MD   Chief Complaint  Patient presents with  . Follow-up    Right Shoulder   Subjective:   Matthew Snyder is a 62 y.o. very pleasant male patient who presents with the following:  F/u R shoulder:  He is really doing remarkably better, ROM and pain is much better. Has gotten a lot out of PT.    Past Medical History, Surgical History, Social History, Family History, Problem List, Medications, and Allergies have been reviewed and updated if relevant.  Patient Active Problem List   Diagnosis Date Noted  . Shoulder pain, right 03/26/2017  . Medial epicondylitis 03/02/2017  . Prostate cancer ([place]) 04/17/2016  . Hyperglycemia 05/10/2015  . Elevated PSA 05/10/2015  . Routine general medical examination at a health care facility 09/27/2011  . History of retinal detachment 05/30/2010  . Other general medical examination for administrative purposes 05/27/2010  . ASTHMA 07/31/2009    Past Medical History:  Diagnosis Date  . Allergic rhinitis   . Anemia    mild, from blood transfusion  . Asthma   . Retinal detachment     Past Surgical History:  Procedure Laterality Date  . CATARACT EXTRACTION  2010   left eye  . RETINAL DETACHMENT SURGERY    . TONSILLECTOMY     child    Social History   Socioeconomic History  . Marital status: Married    Spouse name: Not on file  . Number of children: 1  . Years of education: Not on file  . Highest education level: Not on file  Occupational History  . 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 Financial resource strain: Not on file  . Food insecurity:    Worry: Not on file    Inability: Not on file  . Transportation needs:    Medical: Not on file   Non-medical: Not on file  Tobacco Use  . Smoking status: Never Smoker  . Smokeless tobacco: Never Used  Substance and Sexual Activity  . Alcohol use: Yes    Alcohol/week: 0.0 oz    Comment: occ  . Drug use: No  . Sexual activity: Not on file  Lifestyle  . Physical activity:    Days per week: Not on file    Minutes per session: Not on file  . Stress: Not on file  Relationships  . Social connections:    Talks on phone: Not on file    Gets together: Not on file    Attends religious service: Not on file    Active member of club or organization: Not on file    Attends meetings of clubs or organizations: Not on file    Relationship status: Not on file  . Intimate partner violence:    Fear of current or ex partner: Not on file    Emotionally abused: Not on file    Physically abused: Not on file    Forced sexual activity: Not on file  Other Topics Concern  . Not on file  Social History Narrative   Regular exercise.--runs a lot    Runs 1/2 marathons     Family History  Problem Relation Age of Onset  . Aneurysm Sister        brain  .  Tracheal cancer Brother        lung, smoker  . Prostate cancer Brother   . Renal cancer Neg Hx   . Bladder Cancer Neg Hx     No Known Allergies  Medication list reviewed and updated in full in [facility].   GEN: No acute illnesses, no fevers, chills. GI: No n/v/d, eating normally Pulm: No SOB Interactive and getting along well at home.  Otherwise, ROS is as per the HPI.  Objective:   BP 90/60   Pulse (!) 53   Temp 97.8 F (36.6 C) (Oral)   Ht 5\' 8"  (1.727 m)   Wt 143 lb (64.9 kg)   BMI 21.74 kg/m    GEN: WDWN, NAD, Non-toxic, Alert & Oriented x 3 HEENT: Atraumatic, Normocephalic.  Ears and Nose: No external deformity. EXTR: No clubbing/cyanosis/edema NEURO: Normal gait.  PSYCH: Normally interactive. Conversant. Not depressed or anxious appearing.  Calm demeanor.   Shoulder: R Inspection: No muscle wasting or  winging Ecchymosis/edema: neg  AC joint, scapula, clavicle: NT Cervical spine: NT, full ROM Spurling's: neg Abduction: full, 5/5 Flexion: full, 5/5 IR, full, lift-off: 5/5 - 40 deg less compared to L side ER at neutral: full, 5/5 AC crossover and compression: neg Neer: neg Hawkins: neg Drop Test: neg Empty Can: neg Supraspinatus insertion: NT Bicipital groove: NT Speed's: neg Yergason's: neg Sulcus sign: neg Scapular dyskinesis: none C5-T1 intact Sensation intact Grip 5/5   Laboratory and Imaging Data:  Assessment and Plan:   Scapular dyskinesis  Biceps tendonitis, right  Medial epicondylitis, right  Chronic right shoulder pain  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 He will continue with working on [employer] and HEP at home.   Follow-up: prn  Signed,  Nature Kueker T. Jerami Tammen, MD   Allergies as of 07/14/2017   No Known Allergies     Medication List        Accurate as of 07/14/17  1:35 PM. Always use your most recent med list.          doxylamine (Sleep) 25 MG tablet Commonly known as:  UNISOM Take 25 mg by mouth at bedtime as needed.   Fluticasone-Salmeterol 100-50 MCG/DOSE Aepb Commonly known as:  ADVAIR DISKUS Inhale 1 puff into the lungs 2 (two) times daily.   Melatonin 5 MG Caps Take 1 capsule by mouth at bedtime.

## 2018-01-04 ENCOUNTER — Other Ambulatory Visit: Payer: BC Managed Care – PPO

## 2018-01-04 DIAGNOSIS — C61 Malignant neoplasm of prostate: Secondary | ICD-10-CM

## 2018-01-05 LAB — PSA: PROSTATE SPECIFIC AG, SERUM: 2.3 ng/mL (ref 0.0–4.0)

## 2018-01-06 ENCOUNTER — Ambulatory Visit: Payer: BC Managed Care – PPO | Admitting: Urology

## 2018-01-06 ENCOUNTER — Encounter: Payer: Self-pay | Admitting: Urology

## 2018-01-06 ENCOUNTER — Other Ambulatory Visit: Payer: Self-pay

## 2018-01-06 VITALS — BP 128/74 | HR 66 | Ht 68.0 in | Wt 146.5 lb

## 2018-01-06 DIAGNOSIS — C61 Malignant neoplasm of prostate: Secondary | ICD-10-CM

## 2018-01-06 NOTE — Progress Notes (Signed)
   01/06/2018 9:58 AM   Matthew Snyder [birth year] [id number]  Reason for visit: Follow up very low risk prostate cancer on active surveillance  HPI: I had the pleasure of meeting Matthew Snyder in urology clinic today in follow-up for very low risk prostate cancer.  He was previously followed by Dr. Pilar Jarvis.  He is a 62 year old healthy male who in April 2017 was diagnosed with a single core of Gleason score 3+3 =6 prostate cancer, max core involvement was 5%.  PSA was 3.7, and volume was 43 cc.  He underwent a confirmation biopsy in August 2017 which showed no cancer.  In September 2018 he underwent a prostate MRI for rising PSA to 4.6, this showed a PI-RADS4 1.4 cm lesion in the lateral left gland.  Follow-up MR fusion biopsy showed multiple cores of Gleason 3+3 equal 6 disease with max core involvement of 20%.  PSA today has decreased to 2.3 from 3.1 six months ago.  He denies any changes in his health since we saw him last.  Specifically denies weight loss or bone pain.  He has mild urinary symptoms with nocturia 1 time per night that are minimally bothersome.  There are no aggravating or alleviating factors.   ROS: Please see flowsheet from today's date for complete review of systems.  Physical Exam: BP 128/74   Pulse 66   Ht 5\' 8"  (1.727 m)   Wt 146 lb 8 oz (66.5 kg)   BMI 22.28 kg/m    Constitutional:  Alert and oriented, No acute distress. Respiratory: Normal respiratory effort, no increased work of breathing. GI: Abdomen is soft, nontender, nondistended, no abdominal masses GU: No CVA tenderness DRE: 40 g, smooth, no nodules Skin: No rashes, bruises or suspicious lesions. Neurologic: Grossly intact, no focal deficits, moving all 4 extremities. Psychiatric: Normal mood and affect  Assessment & Plan:   In summary, Matthew Snyder is a 62 year old healthy male with very low risk prostate cancer and active surveillance.  His DRE today is reassuring, and PSA has actually dropped to  2.3 from 3.16 months ago.  We discussed the options at length moving forward including ongoing active surveillance, or intervention with either surgery or radiation.  He would like to continue active surveillance which is very reasonable.  He will follow-up in 6 months for a lab visit for PSA and we'll contact him with those results, then follow-up in 1 year with PSA and DRE.  Billey Co, [facility] [address] [place], [place] [address] [phone]

## 2018-01-10 ENCOUNTER — Ambulatory Visit: Payer: BC Managed Care – PPO | Admitting: Urology

## 2018-05-25 ENCOUNTER — Other Ambulatory Visit: Payer: BC Managed Care – PPO

## 2018-05-26 ENCOUNTER — Other Ambulatory Visit: Payer: BC Managed Care – PPO

## 2018-05-31 ENCOUNTER — Encounter: Payer: BC Managed Care – PPO | Admitting: Family Medicine

## 2018-05-31 ENCOUNTER — Telehealth: Payer: Self-pay

## 2018-05-31 ENCOUNTER — Ambulatory Visit (INDEPENDENT_AMBULATORY_CARE_PROVIDER_SITE_OTHER): Payer: BC Managed Care – PPO | Admitting: Family Medicine

## 2018-05-31 ENCOUNTER — Encounter: Payer: Self-pay | Admitting: Family Medicine

## 2018-05-31 DIAGNOSIS — S30860A Insect bite (nonvenomous) of lower back and pelvis, initial encounter: Secondary | ICD-10-CM

## 2018-05-31 DIAGNOSIS — C61 Malignant neoplasm of prostate: Secondary | ICD-10-CM

## 2018-05-31 DIAGNOSIS — W57XXXA Bitten or stung by nonvenomous insect and other nonvenomous arthropods, initial encounter: Secondary | ICD-10-CM

## 2018-05-31 NOTE — Assessment & Plan Note (Signed)
Continues surveillance with urology Last psa was down  Low risk

## 2018-05-31 NOTE — Progress Notes (Signed)
Virtual Visit via Video Note  I connected with Matthew Snyder on 05/31/18 at 10:15 AM EDT by a video enabled telemedicine application and verified that I am speaking with the correct person using two identifiers. The patient is in his home today  I am in my office    I discussed the limitations of evaluation and management by telemedicine and the availability of in person appointments. The patient expressed understanding and agreed to proceed.  History of Present Illness: Had a tick attach itself to his back 2-3 weeks ago  Thought it was a mole  Then it came loose yesterday  Took off with tweezers  Red and itchy area now   Tick was size of fingernail - was engorged  Was still alive- whole thing removed  No fever or aches  Jogged 5 miles this am  No swollen joints   No rash at all   Used soap and water  Put some neosporin and band aid   Review of Systems  Constitutional: Negative for chills, diaphoresis, fever, malaise/fatigue and weight loss.  HENT: Negative for congestion.   Eyes: Negative for blurred vision.  Respiratory: Negative for cough and shortness of breath.   Cardiovascular: Negative for chest pain and palpitations.  Gastrointestinal: Negative for nausea.  Musculoskeletal: Negative for joint pain and myalgias.  Skin: Positive for itching. Negative for rash.       Tick bite on back  Neurological: Negative for headaches.     Patient Active Problem List   Diagnosis Date Noted  . Tick bite of back 05/31/2018  . Shoulder pain, right 03/26/2017  . Medial epicondylitis 03/02/2017  . Prostate cancer ([facility]) 04/17/2016  . Hyperglycemia 05/10/2015  . Elevated PSA 05/10/2015  . Routine general medical examination at a health care facility 09/27/2011  . History of retinal detachment 05/30/2010  . Other general medical examination for administrative purposes 05/27/2010  . ASTHMA 07/31/2009   Past Medical History:  Diagnosis Date  . Allergic rhinitis   . Anemia     mild, from blood transfusion  . Asthma   . Retinal detachment    Past Surgical History:  Procedure Laterality Date  . CATARACT EXTRACTION  2010   left eye  . RETINAL DETACHMENT SURGERY    . TONSILLECTOMY     child   Social History   Tobacco Use  . Smoking status: Never Smoker  . Smokeless tobacco: Never Used  Substance Use Topics  . Alcohol use: Yes    Alcohol/week: 0.0 standard drinks    Comment: occ  . Drug use: No   Family History  Problem Relation Age of Onset  . Aneurysm Sister        brain  . Tracheal cancer Brother        lung, smoker  . Prostate cancer Brother   . Renal cancer Neg Hx   . Bladder Cancer Neg Hx    No Known Allergies Current Outpatient Medications on File Prior to Visit  Medication Sig Dispense Refill  . doxylamine, Sleep, (UNISOM) 25 MG tablet Take 25 mg by mouth at bedtime as needed.     . Fluticasone-Salmeterol (ADVAIR DISKUS) 100-50 MCG/DOSE AEPB Inhale 1 puff into the lungs 2 (two) times daily. 3 each 3  . Melatonin 5 MG CAPS Take 1 capsule by mouth at bedtime.     No current facility-administered medications on file prior to visit.     Observations/Objective: Pt is well appearing Nl voice change or cough  Nl skin  tone w/o rash (fair)  Round 3-4 cm area of induration and erythema on mid back-w/o central clearing or target appearance In good spirits/pleasant and answers questions appropriately   Assessment and Plan: Problem List Items Addressed This Visit      Musculoskeletal and Integument   Tick bite of back    Pt suspects it was there for several weeks? Now has a round area of erythema and induration (no target shape, rash, fever ,malaise or joint symptoms)  Reassuring Will watch for above (tx with abx if those develop) Disc soap/water/cortisone cream prn for itch  Update if not starting to improve in a week or if worsening   Disc imp of tick checks as well         Genitourinary   Prostate cancer ([place])    Continues  surveillance with urology Last psa was down  Low risk           Follow Up Instructions: Keep tick bite clean with soap and water  Use cortisone cream for itch as needed  Watch for target shape to bite  Also watch for rash anywhere on body Watch for fever or joint pain or fatigue/malaise  We would consider antibiotic if any of these symptoms develop   I discussed the assessment and treatment plan with the patient. The patient was provided an opportunity to ask questions and all were answered. The patient agreed with the plan and demonstrated an understanding of the instructions.   The patient was advised to call back or seek an in-person evaluation if the symptoms worsen or if the condition fails to improve as anticipated.    Loura Pardon, MD

## 2018-05-31 NOTE — Assessment & Plan Note (Signed)
Pt suspects it was there for several weeks? Now has a round area of erythema and induration (no target shape, rash, fever ,malaise or joint symptoms)  Reassuring Will watch for above (tx with abx if those develop) Disc soap/water/cortisone cream prn for itch  Update if not starting to improve in a week or if worsening   Disc imp of tick checks as well

## 2018-05-31 NOTE — Telephone Encounter (Signed)
I will see him then

## 2018-05-31 NOTE — Telephone Encounter (Signed)
Inbound call to triage - patient reports his spouse removed a tick from him yesterday. States he is not having any symptoms but would like a prescription for an antibiotic.   Virtual video appt scheduled 05/31/18 @ 1015.

## 2018-06-27 ENCOUNTER — Telehealth: Payer: Self-pay | Admitting: Family Medicine

## 2018-06-27 DIAGNOSIS — Z0000 Encounter for general adult medical examination without abnormal findings: Secondary | ICD-10-CM

## 2018-06-27 DIAGNOSIS — R7303 Prediabetes: Secondary | ICD-10-CM

## 2018-06-27 NOTE — Telephone Encounter (Signed)
-----   Message from Ellamae Sia sent at 06/22/2018 11:43 AM EDT ----- Regarding: Lab orders for Tuesday, 5.5.20 Lab orders, thanks

## 2018-07-07 ENCOUNTER — Other Ambulatory Visit: Payer: Self-pay

## 2018-07-07 ENCOUNTER — Other Ambulatory Visit: Payer: BC Managed Care – PPO

## 2018-07-07 DIAGNOSIS — C61 Malignant neoplasm of prostate: Secondary | ICD-10-CM

## 2018-07-08 LAB — PSA: Prostate Specific Ag, Serum: 1.8 ng/mL (ref 0.0–4.0)

## 2018-07-12 ENCOUNTER — Telehealth: Payer: Self-pay

## 2018-07-12 NOTE — Telephone Encounter (Signed)
-----   Message from Billey Co, MD sent at 07/11/2018  4:29 PM EDT ----- Doristine Devoid news, PSA stable at 1.8 from 2.3. Continue active surveillance for low risk prostate cancer, he already has follow up with me in 12/2018 for PSA/DRE.  Thanks Nickolas Madrid, MD 07/11/2018

## 2018-07-12 NOTE — Telephone Encounter (Signed)
Mychart notification sent 

## 2018-07-26 ENCOUNTER — Other Ambulatory Visit (INDEPENDENT_AMBULATORY_CARE_PROVIDER_SITE_OTHER): Payer: BC Managed Care – PPO

## 2018-07-26 ENCOUNTER — Other Ambulatory Visit: Payer: BC Managed Care – PPO

## 2018-07-26 DIAGNOSIS — R7303 Prediabetes: Secondary | ICD-10-CM

## 2018-07-26 DIAGNOSIS — Z0000 Encounter for general adult medical examination without abnormal findings: Secondary | ICD-10-CM | POA: Diagnosis not present

## 2018-07-26 LAB — COMPREHENSIVE METABOLIC PANEL
ALT: 29 U/L (ref 0–53)
AST: 30 U/L (ref 0–37)
Albumin: 3.8 g/dL (ref 3.5–5.2)
Alkaline Phosphatase: 29 U/L — ABNORMAL LOW (ref 39–117)
BUN: 22 mg/dL (ref 6–23)
CO2: 31 mEq/L (ref 19–32)
Calcium: 8.7 mg/dL (ref 8.4–10.5)
Chloride: 104 mEq/L (ref 96–112)
Creatinine, Ser: 0.79 mg/dL (ref 0.40–1.50)
GFR: 99.8 mL/min (ref >60.00)
Glucose, Bld: 92 mg/dL (ref 70–99)
Potassium: 3.8 mEq/L (ref 3.5–5.1)
Sodium: 140 mEq/L (ref 135–145)
Total Bilirubin: 0.5 mg/dL (ref 0.2–1.2)
Total Protein: 5.6 g/dL — ABNORMAL LOW (ref 6.0–8.3)

## 2018-07-26 LAB — LIPID PANEL
Cholesterol: 167 mg/dL (ref 0–200)
HDL: 76.4 mg/dL (ref >39.00)
LDL Cholesterol: 86 mg/dL (ref 0–99)
NonHDL: 90.73
Total CHOL/HDL Ratio: 2
Triglycerides: 26 mg/dL (ref 0.0–149.0)
VLDL: 5.2 mg/dL (ref 0.0–40.0)

## 2018-07-26 LAB — CBC WITH DIFFERENTIAL/PLATELET
Basophils Absolute: 0 10*3/uL (ref 0.0–0.1)
Basophils Relative: 1.8 % (ref 0.0–3.0)
Eosinophils Absolute: 0.1 10*3/uL (ref 0.0–0.7)
Eosinophils Relative: 3 % (ref 0.0–5.0)
HCT: 36.9 % — ABNORMAL LOW (ref 39.0–52.0)
Hemoglobin: 12.6 g/dL — ABNORMAL LOW (ref 13.0–17.0)
Lymphocytes Relative: 43.3 % (ref 12.0–46.0)
Lymphs Abs: 1.2 10*3/uL (ref 0.7–4.0)
MCHC: 34.2 g/dL (ref 30.0–36.0)
MCV: 87 fl (ref 78.0–100.0)
Monocytes Absolute: 0.2 10*3/uL (ref 0.1–1.0)
Monocytes Relative: 6.9 % (ref 3.0–12.0)
Neutro Abs: 1.2 10*3/uL — ABNORMAL LOW (ref 1.4–7.7)
Neutrophils Relative %: 45 % (ref 43.0–77.0)
Platelets: 126 10*3/uL — ABNORMAL LOW (ref 150.0–400.0)
RBC: 4.24 Mil/uL (ref 4.22–5.81)
RDW: 14.7 % (ref 11.5–15.5)
WBC: 2.7 10*3/uL — ABNORMAL LOW (ref 4.0–10.5)

## 2018-07-26 LAB — HEMOGLOBIN A1C: Hgb A1c MFr Bld: 6.1 % (ref 4.6–6.5)

## 2018-07-26 LAB — TSH: TSH: 1.65 u[IU]/mL (ref 0.35–4.50)

## 2018-08-02 ENCOUNTER — Encounter: Payer: Self-pay | Admitting: Family Medicine

## 2018-08-02 ENCOUNTER — Ambulatory Visit (INDEPENDENT_AMBULATORY_CARE_PROVIDER_SITE_OTHER): Payer: BC Managed Care – PPO | Admitting: Family Medicine

## 2018-08-02 ENCOUNTER — Other Ambulatory Visit: Payer: Self-pay

## 2018-08-02 VITALS — BP 115/70 | HR 66 | Temp 98.0°F | Ht 68.0 in | Wt 135.1 lb

## 2018-08-02 DIAGNOSIS — R7303 Prediabetes: Secondary | ICD-10-CM

## 2018-08-02 DIAGNOSIS — Z0000 Encounter for general adult medical examination without abnormal findings: Secondary | ICD-10-CM

## 2018-08-02 DIAGNOSIS — C61 Malignant neoplasm of prostate: Secondary | ICD-10-CM | POA: Diagnosis not present

## 2018-08-02 DIAGNOSIS — D696 Thrombocytopenia, unspecified: Secondary | ICD-10-CM | POA: Insufficient documentation

## 2018-08-02 DIAGNOSIS — J4520 Mild intermittent asthma, uncomplicated: Secondary | ICD-10-CM

## 2018-08-02 DIAGNOSIS — Z23 Encounter for immunization: Secondary | ICD-10-CM | POA: Diagnosis not present

## 2018-08-02 MED ORDER — FLUTICASONE-SALMETEROL 100-50 MCG/DOSE IN AEPB: 1.0000 | INHALATION_SPRAY | Freq: Two times a day (BID) | RESPIRATORY_TRACT | 3 refills | Status: AC

## 2018-08-02 NOTE — Patient Instructions (Addendum)
You should get a reminder (colonoscopy is due in august)  If not - call us to schedule in august   If elbow/hand do not improve- call for an appt with Dr Lorelei Pont Try ice 10 minutes at a time on elbow whenever you can   Try debrox for ear wax  Use it to loosen ear wax as directed once weekly   Take care of yourself  Keep up protein intake as long as you are running   Wear sunscreen See the dermatologist as planned

## 2018-08-02 NOTE — Assessment & Plan Note (Signed)
Refilled advair which works well

## 2018-08-02 NOTE — Assessment & Plan Note (Signed)
Mild with mild anemia and leukopenia  plt ct 126 Hb 12.6 Due for colonoscopy this year  No bleeding/clotting or other symptoms Continue to monitor

## 2018-08-02 NOTE — Assessment & Plan Note (Signed)
Doing well with surveillance by urology  Last PSA down to 1.8 from 2.3 No symptoms besides occ strain to urinate

## 2018-08-02 NOTE — Assessment & Plan Note (Signed)
Lab Results  Component Value Date   HGBA1C 6.1 07/26/2018   Will continue to follow disc imp of low glycemic diet and wt loss to prevent DM2  Family h/o DM and very good habits

## 2018-08-02 NOTE — Assessment & Plan Note (Addendum)
Reviewed health habits including diet and exercise and skin cancer prevention Reviewed appropriate screening tests for age  Also reviewed health mt list, fam hx and immunization status , as well as social and family history   See HPI Labs reviewed  Continue urology f/u  Excellent diet and exercise habits Tdap vaccine today Due for colonoscopy this year-pt aware/ will contact us if he does not get a reminder

## 2018-08-02 NOTE — Progress Notes (Signed)
Subjective:    Patient ID: Matthew Snyder, male    DOB: 1955-10-30, 63 y.o.   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  Going fine  Not much of a change  Wears a mask in public  Is careful   Feeling fine   Weight Wt Readings from Last 3 Encounters:  08/02/18 135 lb 1.6 oz (61.3 kg)  01/06/18 146 lb 8 oz (66.5 kg)  07/14/17 143 lb (64.9 kg)  wt is down  Running more  1/2 marathon- training for (cancelled it for June) -it will not be re scheduled  He can turn in time for virtual race/plans to do it  20.54 kg/m   He is eating enough   Tetanus shot 4/09 Needs Tdap   Flu shot-had last year   Colonoscopy 3/10  10 y recall  Has not rec card in mail - Dr Ardis Hughs   H/o prostate cancer Low risk  Sees urology Last psa with urology was 1.8 (down from 2.3)  Continues active surveillance and has f/u in 11/20 Sometimes has to strain to empty bladder (he may have overactive bladder)  Does not cause any problems   BP Readings from Last 3 Encounters:  08/02/18 115/70  01/06/18 128/74  07/14/17 90/60   Pulse Readings from Last 3 Encounters:  08/02/18 66  01/06/18 66  07/14/17 (!) 53   He tried to get shingrix  We will put him on list  Sleep is a bit better  Stopped caffeine and sleep aides Using CBT  Working backwards with his bedtime (forced himself to stay up later at first)  He can fall asleep -staying asleep is the problem  Sleep hygiene is good as well  Sleep quality is better   Did PT for his elbow  It helped  Now hurt other elbow (L) - ? 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  Medial pain   Prediabetes Lab Results  Component Value Date   HGBA1C 6.1 07/26/2018  up from 5.9  Oldest brother had DM in his 72s  Tries to watch sugar    Cholesterol Lab Results  Component Value Date   CHOL 167 07/26/2018   CHOL 165 05/11/2017   CHOL 182 05/07/2016   Lab Results  Component Value Date   HDL 76.40 07/26/2018   HDL 74.60 05/11/2017   HDL 78.50 05/07/2016   Lab Results  Component Value Date   LDLCALC 86 07/26/2018   LDLCALC 82 05/11/2017   LDLCALC 98 05/07/2016   Lab Results  Component Value Date   TRIG 26.0 07/26/2018   TRIG 38.0 05/11/2017   TRIG 27.0 05/07/2016   Lab Results  Component Value Date   CHOLHDL 2 07/26/2018   CHOLHDL 2 05/11/2017   CHOLHDL 2 05/07/2016   No results found for: LDLDIRECT Still very good profile  Diet is fairly good Running for exercise   Other labs Lab Results  Component Value Date   CREATININE 0.79 07/26/2018   BUN 22 07/26/2018   NA 140 07/26/2018   K 3.8 07/26/2018   CL 104 07/26/2018   CO2 31 07/26/2018   Glucose 92 Lab Results  Component Value Date   ALT 29 07/26/2018   AST 30 07/26/2018   ALKPHOS 29 (L) 07/26/2018   BILITOT 0.5 07/26/2018  Lab Results  Component Value Date   WBC 2.7 (L) 07/26/2018   HGB 12.6 (L) 07/26/2018   HCT 36.9 (L) 07/26/2018   MCV 87.0 07/26/2018   PLT 126.0 (L) 07/26/2018   Lab Results  Component Value Date   TSH 1.65 07/26/2018    Has not donated blood  mcv normal  Does not eat a lot of meat Eats greens  Platelet ct 126   Patient Active Problem List   Diagnosis Date Noted  . Tick bite of back 05/31/2018  . Shoulder pain, right 03/26/2017  . Medial epicondylitis 03/02/2017  . Prostate cancer ([place]) 04/17/2016  . Prediabetes 05/10/2015  . Elevated PSA 05/10/2015  . Routine general medical examination at a health care facility 09/27/2011  . History of retinal detachment 05/30/2010  . Other general medical examination for administrative purposes 05/27/2010  . ASTHMA 07/31/2009   Past Medical History:  Diagnosis Date  . Allergic rhinitis   . Anemia    mild, from blood transfusion  . Asthma   . Retinal detachment    Past Surgical History:  Procedure  Laterality Date  . CATARACT EXTRACTION  2010   left eye  . RETINAL DETACHMENT SURGERY    . TONSILLECTOMY     child   Social History   Tobacco Use  . Smoking status: Never Smoker  . Smokeless tobacco: Never Used  Substance Use Topics  . Alcohol use: Yes    Alcohol/week: 0.0 standard drinks    Comment: occ  . Drug use: No   Family History  Problem Relation Age of Onset  . Aneurysm Sister        brain  . Tracheal cancer Brother        lung, smoker  . Prostate cancer Brother   . Diabetes type II Brother        in 38s   . Renal cancer Neg Hx   . Bladder Cancer Neg Hx    No Known Allergies Current Outpatient Medications on File Prior to Visit  Medication Sig Dispense Refill  . Melatonin 5 MG CAPS Take 1 capsule by mouth at bedtime.    Marland Kitchen doxylamine, Sleep, (UNISOM) 25 MG tablet Take 25 mg by mouth at bedtime as needed.      No current facility-administered medications on file prior to visit.      Review of Systems  Constitutional: Negative for activity change, appetite change, fatigue, fever and unexpected weight change.  HENT: Negative for congestion, rhinorrhea, sore throat and trouble swallowing.   Eyes: Negative for pain, redness, itching and visual disturbance.  Respiratory: Negative for cough, chest tightness, shortness of breath and wheezing.   Cardiovascular: Negative for chest pain and palpitations.  Gastrointestinal: Negative for abdominal pain, blood in stool, constipation, diarrhea and nausea.  Endocrine: Negative for cold intolerance, heat intolerance, polydipsia and polyuria.  Genitourinary: Negative for difficulty urinating, dysuria, frequency and urgency.  Musculoskeletal: Negative for arthralgias, joint swelling and myalgias.       L elbow pain     Skin: Negative for pallor and rash.  Neurological: Negative for dizziness, tremors, weakness, numbness and headaches.  Hematological: Negative for adenopathy. Does not bruise/bleed easily.   Psychiatric/Behavioral: Positive for sleep disturbance. Negative for decreased concentration and dysphoric mood. The patient is not nervous/anxious.        Sleep problems are gradually improving        Objective:   Physical Exam Constitutional:      General: He is not in acute distress.  Appearance: Normal appearance. He is well-developed and normal weight. He is not ill-appearing.     Comments: Slim and well appearing  HENT:     Head: Normocephalic and atraumatic.     Right Ear: Tympanic membrane, ear canal and external ear normal.     Left Ear: Tympanic membrane, ear canal and external ear normal.     Nose: Nose normal.     Mouth/Throat:     Mouth: Mucous membranes are moist.     Pharynx: Oropharynx is clear. No posterior oropharyngeal erythema.  Eyes:     General: No scleral icterus.       Right eye: No discharge.        Left eye: No discharge.     Conjunctiva/sclera: Conjunctivae normal.     Pupils: Pupils are equal, round, and reactive to light.  Neck:     Musculoskeletal: Normal range of motion and neck supple. No muscular tenderness.     Thyroid: No thyromegaly.     Vascular: No carotid bruit or JVD.  Cardiovascular:     Rate and Rhythm: Normal rate and regular rhythm.     Heart sounds: Normal heart sounds. No gallop.   Pulmonary:     Effort: Pulmonary effort is normal. No respiratory distress.     Breath sounds: Normal breath sounds. No wheezing.  Chest:     Chest wall: No tenderness.  Abdominal:     General: Bowel sounds are normal. There is no distension or abdominal bruit.     Palpations: Abdomen is soft. There is no mass.     Tenderness: There is no abdominal tenderness. There is no guarding.  Musculoskeletal:        General: No tenderness.     Right lower leg: No edema.     Left lower leg: No edema.  Lymphadenopathy:     Cervical: No cervical adenopathy.  Skin:    General: Skin is warm and dry.     Capillary Refill: Capillary refill takes less than 2  seconds.     Coloration: Skin is not pale.     Findings: No erythema or rash.     Comments: Fair complexion  Many flat nevi on trunk of different sizes Also sks diffusely  Neurological:     General: No focal deficit present.     Mental Status: He is alert.     Cranial Nerves: No cranial nerve deficit.     Motor: No abnormal muscle tone.     Coordination: Coordination normal.     Gait: Gait normal.     Deep Tendon Reflexes: Reflexes are normal and symmetric. Reflexes normal.  Psychiatric:        Mood and Affect: Mood normal.     Comments: Mood is good Talkative            Assessment & Plan:   Problem List Items Addressed This Visit      Respiratory   Asthma    Refilled advair which works well       Relevant Medications   Fluticasone-Salmeterol (ADVAIR DISKUS) 100-50 MCG/DOSE AEPB     Genitourinary   Prostate cancer ([place])    Doing well with surveillance by urology  Last PSA down to 1.8 from 2.3 No symptoms besides occ strain to urinate        Other   Routine general medical examination at a health care facility - Primary    Reviewed health habits including diet and exercise and skin cancer prevention Reviewed  appropriate screening tests for age  Also reviewed health mt list, fam hx and immunization status , as well as social and family history   See HPI Labs reviewed  Continue urology f/u  Excellent diet and exercise habits Tdap vaccine today Due for colonoscopy this year-pt aware/ will contact us if he does not get a reminder      Prediabetes    Lab Results  Component Value Date   HGBA1C 6.1 07/26/2018   Will continue to follow disc imp of low glycemic diet and wt loss to prevent DM2  Family h/o DM and very good habits       Thrombocytopenia ([facility])    Mild with mild anemia and leukopenia  plt ct 126 Hb 12.6 Due for colonoscopy this year  No bleeding/clotting or other symptoms Continue to monitor       Other Visit Diagnoses    Need for Tdap  vaccination       Relevant Orders   Tdap vaccine greater than or equal to 7yo IM (Completed)

## 2018-08-24 ENCOUNTER — Telehealth: Payer: Self-pay | Admitting: Family Medicine

## 2018-08-24 NOTE — Telephone Encounter (Signed)
Patient scheduled appointment on 09/13/18.

## 2018-08-24 NOTE — Telephone Encounter (Signed)
Please call pt to set up shingrix vaccine from wait list. Best dates are 7/14, 7/21, 7/28, 8/4, 8/11. Slots are 1130, 1145, 1200, 230, 245, 300, 315, 330. °

## 2018-09-13 ENCOUNTER — Ambulatory Visit (INDEPENDENT_AMBULATORY_CARE_PROVIDER_SITE_OTHER): Payer: BC Managed Care – PPO | Admitting: *Deleted

## 2018-09-13 DIAGNOSIS — Z23 Encounter for immunization: Secondary | ICD-10-CM

## 2018-09-13 NOTE — Addendum Note (Signed)
Addended by: Jacqualin Combes on: 09/13/2018 12:17 PM   Modules accepted: Orders

## 2018-09-19 ENCOUNTER — Encounter: Payer: Self-pay | Admitting: Gastroenterology

## 2018-11-22 ENCOUNTER — Encounter: Payer: Self-pay | Admitting: Gastroenterology

## 2018-12-09 ENCOUNTER — Telehealth: Payer: Self-pay | Admitting: *Deleted

## 2018-12-09 NOTE — Telephone Encounter (Signed)
Left message on voicemail for patient to call back. When patient returns the call he needs to be screened for covid because he has nurse scheduled Tuesday. Advise patient of curbside nurse visit.

## 2018-12-13 ENCOUNTER — Other Ambulatory Visit: Payer: Self-pay

## 2018-12-13 ENCOUNTER — Ambulatory Visit (INDEPENDENT_AMBULATORY_CARE_PROVIDER_SITE_OTHER): Payer: BC Managed Care – PPO

## 2018-12-13 DIAGNOSIS — Z23 Encounter for immunization: Secondary | ICD-10-CM

## 2018-12-13 NOTE — Progress Notes (Signed)
Patient received his Shingrix #2 today. Patient tolerated injection well. Patient did not report any side effects or issues with first Shingrix.

## 2018-12-19 DIAGNOSIS — M79672 Pain in left foot: Secondary | ICD-10-CM | POA: Insufficient documentation

## 2019-01-05 ENCOUNTER — Other Ambulatory Visit: Payer: Self-pay

## 2019-01-05 ENCOUNTER — Other Ambulatory Visit: Payer: BC Managed Care – PPO

## 2019-01-05 DIAGNOSIS — C61 Malignant neoplasm of prostate: Secondary | ICD-10-CM

## 2019-01-06 LAB — PSA: Prostate Specific Ag, Serum: 3.7 ng/mL (ref 0.0–4.0)

## 2019-01-09 ENCOUNTER — Other Ambulatory Visit: Payer: Self-pay

## 2019-01-09 ENCOUNTER — Telehealth: Payer: Self-pay | Admitting: Urology

## 2019-01-09 ENCOUNTER — Ambulatory Visit: Payer: BC Managed Care – PPO | Admitting: Urology

## 2019-01-09 ENCOUNTER — Encounter: Payer: Self-pay | Admitting: Gastroenterology

## 2019-01-09 ENCOUNTER — Ambulatory Visit (AMBULATORY_SURGERY_CENTER): Payer: Self-pay

## 2019-01-09 ENCOUNTER — Encounter: Payer: Self-pay | Admitting: Urology

## 2019-01-09 VITALS — BP 134/74 | HR 54 | Ht 68.0 in | Wt 144.1 lb

## 2019-01-09 VITALS — Temp 96.8°F | Ht 66.0 in | Wt 145.4 lb

## 2019-01-09 DIAGNOSIS — Z1211 Encounter for screening for malignant neoplasm of colon: Secondary | ICD-10-CM

## 2019-01-09 DIAGNOSIS — C61 Malignant neoplasm of prostate: Secondary | ICD-10-CM | POA: Diagnosis not present

## 2019-01-09 LAB — BLADDER SCAN AMB NON-IMAGING

## 2019-01-09 NOTE — Progress Notes (Signed)
   01/09/2019 11:32 AM   [name] 06-26-1955 [id number]  Reason for visit: Follow up very low risk prostate cancer  HPI: I saw Matthew Snyder back in urology clinic for his active surveillance for very low risk prostate cancer.  He is a 63 year old healthy male who was originally diagnosed by Dr. Baruch Gouty in April 2017 with a single core of Gleason score 3+3=6 prostate cancer with max core involvement of 5%.  PSA was 3.7, volume was 43 cc, and PSA density was 0.09. He underwent a confirmation biopsy in August 2017 which showed no cancer.  In September 2018 he underwent a prostate MRI for rising PSA to 4.6, this showed a PI-RADS4 1.4 cm lesion in the lateral left gland.  Follow-up MRI fusion biopsy showed multiple cores of Gleason 3+3=6 disease with max core involvement of 20%.  His PSA has been quite variable over the last 5 years, but is essentially stable at 3.7 in November 2020 from 3.7 at time of diagnosis.  PSA history 09/2011: 1.98 04/2014: 2.17 04/2015: 3.73 10/2015: 2.84 09/2016: 4.6  05/2017: 3.1 12/2017: 2.3 06/2018: 1.8 12/2018: 3.7  DRE today 40 g, smooth, no nodules or masses  We had a long conversation about very low risk prostate cancer and active surveillance as the first-line management strategy per the AUA guidelines.  We discussed alternatives including radiation or robotic prostatectomy.  He would like to continue active surveillance.  He recently moved to [name] area, and will be establishing routine care with a urologist locally.  I recommended ongoing PSA/DRE screening every 6 to 12 months, and we discussed his overall very reassuring PSA trend.  Continue active surveillance for very low risk prostate cancer with new urologist locally in [facility]  A total of 15 minutes were spent face-to-face with the patient, greater than 50% was spent in patient education, counseling, and coordination of care regarding active surveillance for very low risk prostate  cancer.  Billey Co, [facility] [address], [place], [place] [address] [phone]

## 2019-01-09 NOTE — Telephone Encounter (Signed)
Patient has moved to [facility] and once he finds a new doctor he will call back and let us know where to fax his records. He has signed a medical release form and it will be faxed in his chart. I will keep the original in my office.   Sharyn Lull

## 2019-01-09 NOTE — Progress Notes (Signed)
Denies allergies to eggs or soy products. Denies complication of anesthesia or sedation. Denies use of weight loss medication. Denies use of O2.   Emmi instructions given for colonoscopy.  Covid screening is scheduled for Wednesday 01/18/19 @ 9:20 Am.

## 2019-01-10 ENCOUNTER — Ambulatory Visit: Payer: BC Managed Care – PPO | Admitting: Urology

## 2019-01-16 ENCOUNTER — Ambulatory Visit: Payer: BC Managed Care – PPO | Admitting: Urology

## 2019-01-18 ENCOUNTER — Other Ambulatory Visit: Payer: Self-pay | Admitting: Gastroenterology

## 2019-01-18 ENCOUNTER — Ambulatory Visit: Payer: BC Managed Care – PPO

## 2019-01-18 DIAGNOSIS — Z1159 Encounter for screening for other viral diseases: Secondary | ICD-10-CM

## 2019-01-20 LAB — SARS CORONAVIRUS 2 (TAT 6-24 HRS): SARS Coronavirus 2: NEGATIVE

## 2019-01-23 ENCOUNTER — Other Ambulatory Visit: Payer: Self-pay

## 2019-01-23 ENCOUNTER — Encounter: Payer: Self-pay | Admitting: Gastroenterology

## 2019-01-23 ENCOUNTER — Ambulatory Visit (AMBULATORY_SURGERY_CENTER): Payer: BC Managed Care – PPO | Admitting: Gastroenterology

## 2019-01-23 VITALS — BP 99/68 | HR 59 | Temp 98.0°F | Resp 19 | Ht 68.0 in | Wt 145.0 lb

## 2019-01-23 DIAGNOSIS — Z1211 Encounter for screening for malignant neoplasm of colon: Secondary | ICD-10-CM

## 2019-01-23 DIAGNOSIS — D120 Benign neoplasm of cecum: Secondary | ICD-10-CM | POA: Diagnosis not present

## 2019-01-23 MED ORDER — SODIUM CHLORIDE 0.9 % IV SOLN: 500.0000 mL | Freq: Once | INTRAVENOUS | Status: DC

## 2019-01-23 NOTE — Progress Notes (Signed)
Called to room to assist during endoscopic procedure.  Patient ID and intended procedure confirmed with present staff. Received instructions for my participation in the procedure from the performing physician.  

## 2019-01-23 NOTE — Op Note (Signed)
[place] Patient Name: Matthew Snyder Procedure Date: 01/23/2019 8:59 AM MRN: [id number] Endoscopist: Milus Banister , MD Age: 63 Referring MD:  Date of Birth: 30-Apr-1955 Gender: Male Account #: [id number] Procedure:                Colonoscopy Indications:              Screening for colorectal malignant neoplasm Medicines:                Monitored Anesthesia Care Procedure:                Pre-Anesthesia Assessment:                           - Prior to the procedure, a History and Physical                            was performed, and patient medications and                            allergies were reviewed. The patient's tolerance of                            previous anesthesia was also reviewed. The risks                            and benefits of the procedure and the sedation                            options and risks were discussed with the patient.                            All questions were answered, and informed consent                            was obtained. Prior Anticoagulants: The patient has                            taken no previous anticoagulant or antiplatelet                            agents. ASA Grade Assessment: II - A patient with                            mild systemic disease. After reviewing the risks                            and benefits, the patient was deemed in                            satisfactory condition to undergo the procedure.                           After obtaining informed consent, the colonoscope  was passed under direct vision. Throughout the                            procedure, the patient's blood pressure, pulse, and                            oxygen saturations were monitored continuously. The                            Colonoscope was introduced through the anus and                            advanced to the the cecum, identified by                            appendiceal orifice and  ileocecal valve. The                            colonoscopy was performed without difficulty. The                            patient tolerated the procedure well. The quality                            of the bowel preparation was good. The ileocecal                            valve, appendiceal orifice, and rectum were                            photographed. Scope In: 9:06:55 AM Scope Out: 9:23:46 AM Scope Withdrawal Time: 0 hours 9 minutes 53 seconds  Total Procedure Duration: 0 hours 16 minutes 51 seconds  Findings:                 A 3 mm polyp was found in the cecum. The polyp was                            sessile. The polyp was removed with a cold snare.                            Resection and retrieval were complete.                           The exam was otherwise without abnormality on                            direct and retroflexion views. Complications:            No immediate complications. Estimated blood loss:                            None. Estimated Blood Loss:     Estimated blood loss: none. Impression:               -  One 3 mm polyp in the cecum, removed with a cold                            snare. Resected and retrieved.                           - The examination was otherwise normal on direct                            and retroflexion views. Recommendation:           - Patient has a contact number available for                            emergencies. The signs and symptoms of potential                            delayed complications were discussed with the                            patient. Return to normal activities tomorrow.                            Written discharge instructions were provided to the                            patient.                           - Resume previous diet.                           - Continue present medications.                           - Await pathology results. Milus Banister, MD 01/23/2019 9:25:34 AM This report  has been signed electronically.

## 2019-01-23 NOTE — Progress Notes (Signed)
To PACU, VSS. Report to [email or web address] 

## 2019-01-23 NOTE — Progress Notes (Signed)
Pt's states no medical or surgical changes since previsit or office visit. Matthew Snyder-checked patient in.

## 2019-01-23 NOTE — Patient Instructions (Signed)
Please read all of the handouts given to you by your recovery room nurse.  Thank-you for choosing [place] for your healthcare needs today.  YOU HAD AN ENDOSCOPIC PROCEDURE TODAY AT [facility]:   Refer to the procedure report that was given to you for any specific questions about what was found during the examination.  If the procedure report does not answer your questions, please call your gastroenterologist to clarify.  If you requested that your care partner not be given the details of your procedure findings, then the procedure report has been included in a sealed envelope for you to review at your convenience later.  YOU SHOULD EXPECT: Some feelings of bloating in the abdomen. Passage of more gas than usual.  Walking can help get rid of the air that was put into your GI tract during the procedure and reduce the bloating. If you had a lower endoscopy (such as a colonoscopy or flexible sigmoidoscopy) you may notice spotting of blood in your stool or on the toilet paper. If you underwent a bowel prep for your procedure, you may not have a normal bowel movement for a few days.  Please Note:  You might notice some irritation and congestion in your nose or some drainage.  This is from the oxygen used during your procedure.  There is no need for concern and it should clear up in a day or so.  SYMPTOMS TO REPORT IMMEDIATELY:   Following lower endoscopy (colonoscopy or flexible sigmoidoscopy):  Excessive amounts of blood in the stool  Significant tenderness or worsening of abdominal pains  Swelling of the abdomen that is new, acute  Fever of 100F or higher   For urgent or emergent issues, a gastroenterologist can be reached at any hour by calling [phone].   DIET:  We do recommend a small meal at first, but then you may proceed to your regular diet.  Drink plenty of fluids but you should avoid alcoholic beverages for 24 hours.  ACTIVITY:  You should plan to take it easy for the  rest of today and you should NOT DRIVE or use heavy machinery until tomorrow (because of the sedation medicines used during the test).    FOLLOW UP: Our staff will call the number listed on your records 48-72 hours following your procedure to check on you and address any questions or concerns that you may have regarding the information given to you following your procedure. If we do not reach you, we will leave a message.  We will attempt to reach you two times.  During this call, we will ask if you have developed any symptoms of COVID 19. If you develop any symptoms (ie: fever, flu-like symptoms, shortness of breath, cough etc.) before then, please call [phone].  If you test positive for Covid 19 in the 2 weeks post procedure, please call and report this information to [place].    If any biopsies were taken you will be contacted by phone or by letter within the next 1-3 weeks.  Please call us at [phone] if you have not heard about the biopsies in 3 weeks.    SIGNATURES/CONFIDENTIALITY: You and/or your care partner have signed paperwork which will be entered into your electronic medical record.  These signatures attest to the fact that that the information above on your After Visit Summary has been reviewed and is understood.  Full responsibility of the confidentiality of this discharge information lies with you and/or your care-partner.

## 2019-01-25 ENCOUNTER — Telehealth: Payer: Self-pay | Admitting: *Deleted

## 2019-01-25 ENCOUNTER — Telehealth: Payer: Self-pay

## 2019-01-25 NOTE — Telephone Encounter (Signed)
LVM

## 2019-01-25 NOTE — Telephone Encounter (Signed)
  Follow up Call-  Call back number 01/23/2019  Post procedure Call Back phone  # [phone]  Permission to leave phone message Yes  Some recent data might be hidden     Patient questions:  Do you have a fever, pain , or abdominal swelling? No. Pain Score  0 *  Have you tolerated food without any problems? Yes.    Have you been able to return to your normal activities? Yes.    Do you have any questions about your discharge instructions: Diet   No. Medications  No. Follow up visit  No.  Do you have questions or concerns about your Care? No.  Actions: * If pain score is 4 or above: 1. No action needed, pain <4.Have you developed a fever since your procedure? no  2.   Have you had an respiratory symptoms (SOB or cough) since your procedure? no  3.   Have you tested positive for COVID 19 since your procedure no  4.   Have you had any family members/close contacts diagnosed with the COVID 19 since your procedure?  no   If yes to any of these questions please route to Joylene John, RN and Alphonsa Gin, Therapist, sports.

## 2019-01-26 ENCOUNTER — Encounter: Payer: Self-pay | Admitting: Gastroenterology

## 2019-07-31 ENCOUNTER — Telehealth: Payer: Self-pay | Admitting: Family Medicine

## 2019-07-31 DIAGNOSIS — D696 Thrombocytopenia, unspecified: Secondary | ICD-10-CM

## 2019-07-31 DIAGNOSIS — Z0000 Encounter for general adult medical examination without abnormal findings: Secondary | ICD-10-CM

## 2019-07-31 DIAGNOSIS — R7303 Prediabetes: Secondary | ICD-10-CM

## 2019-07-31 NOTE — Telephone Encounter (Signed)
-----   Message from Cloyd Stagers, RT sent at 07/18/2019  2:11 PM EDT ----- Regarding: Lab Orders for Tuesday 6.8.2021 Please place lab orders for Tuesday 6.8.2021, office visit for physical on Tuesday 6.15.2021 Thank you, Dyke Maes RT(R)

## 2019-08-01 ENCOUNTER — Other Ambulatory Visit: Payer: BC Managed Care – PPO

## 2019-08-08 ENCOUNTER — Encounter: Payer: BC Managed Care – PPO | Admitting: Family Medicine

## 2020-01-09 IMAGING — DX DG SHOULDER 2+V*R*
3 series · 3 of 3 positions shown · non-contrast
Comparison: None.

CLINICAL DATA: Right shoulder pain for 3-4 years.  No known injury.

EXAM:
RIGHT SHOULDER - 2+ VIEW

[shoulder axial]
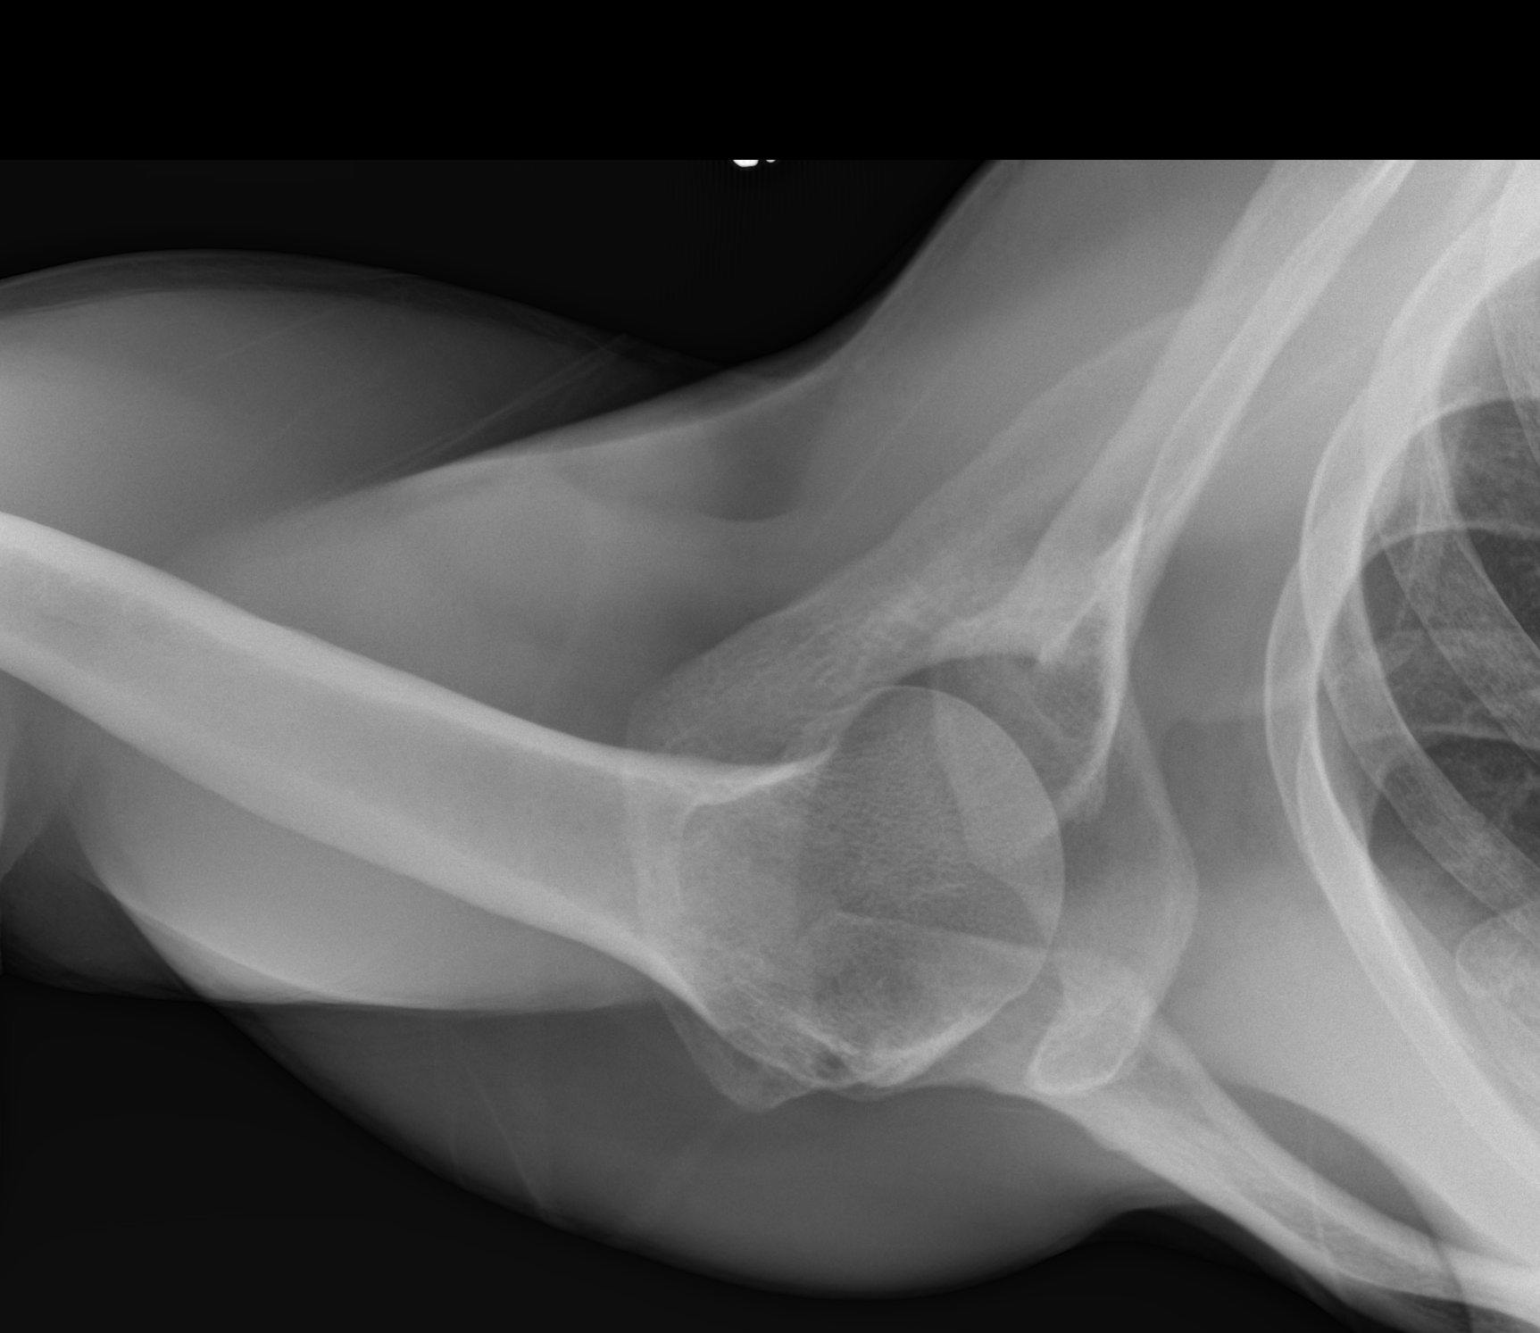

[shoulder ap]
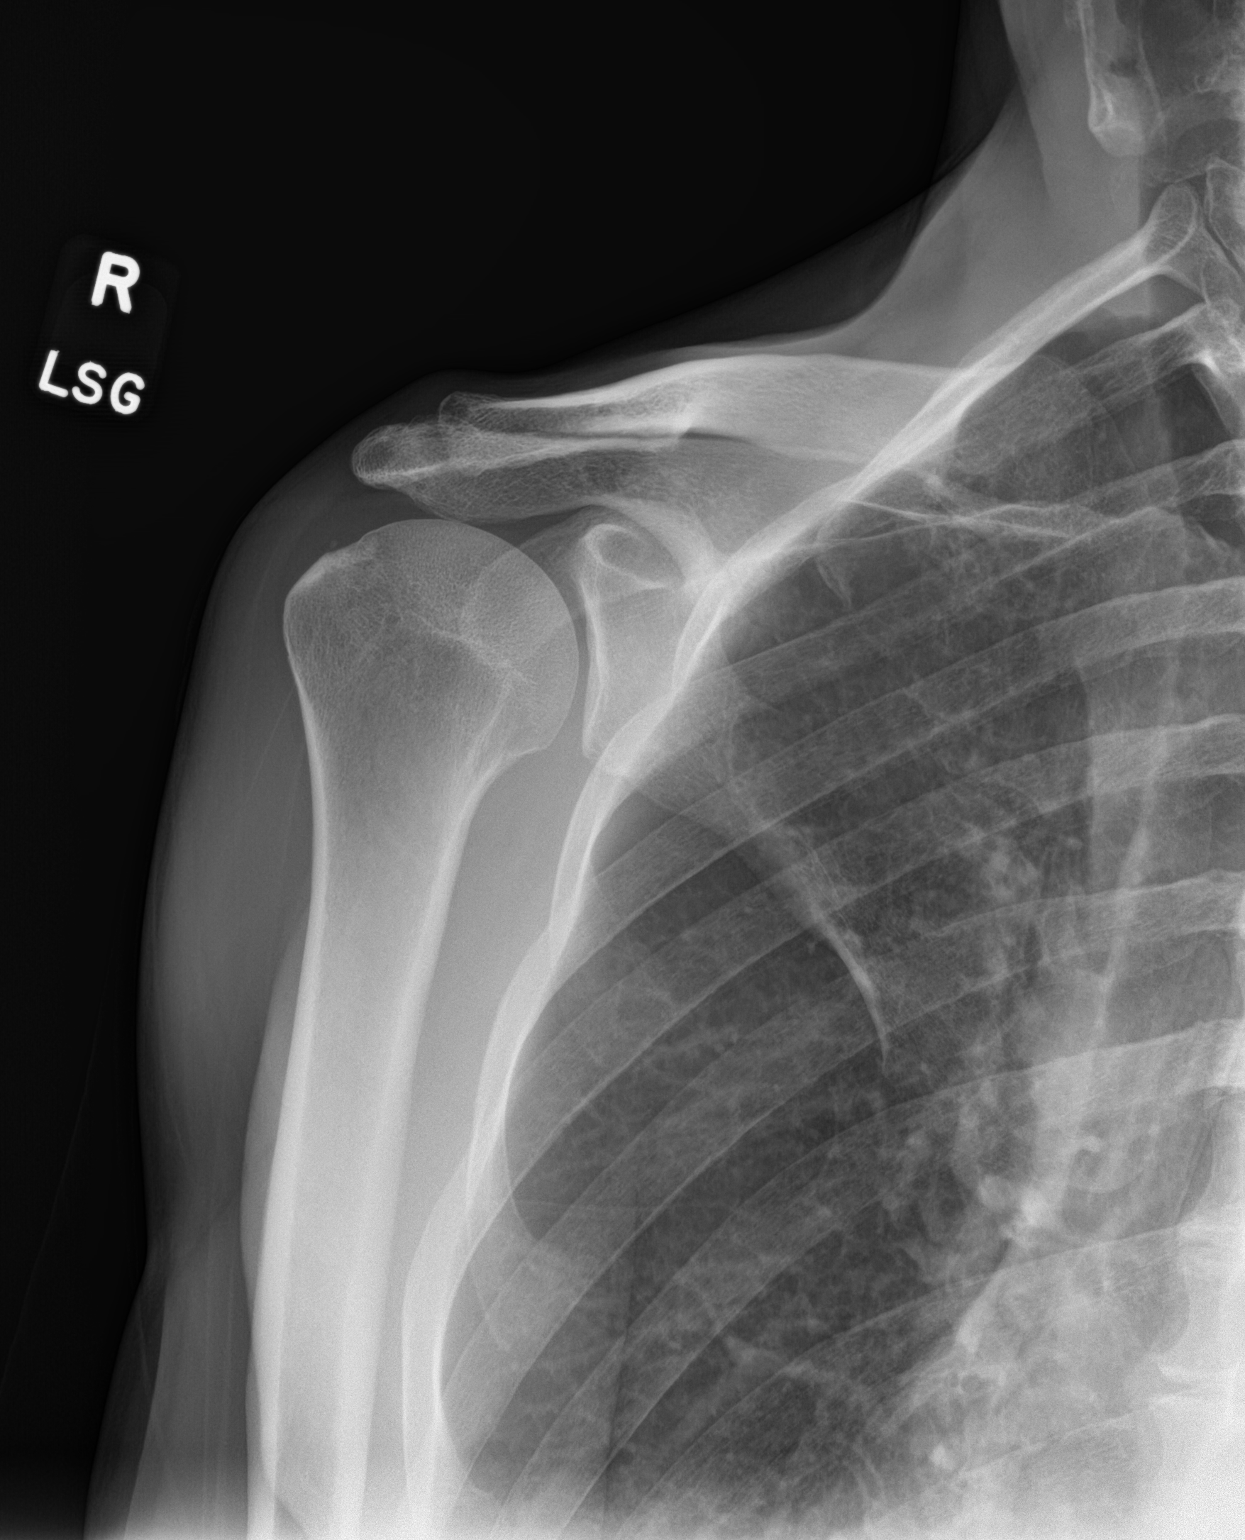

[shoulder y-view]
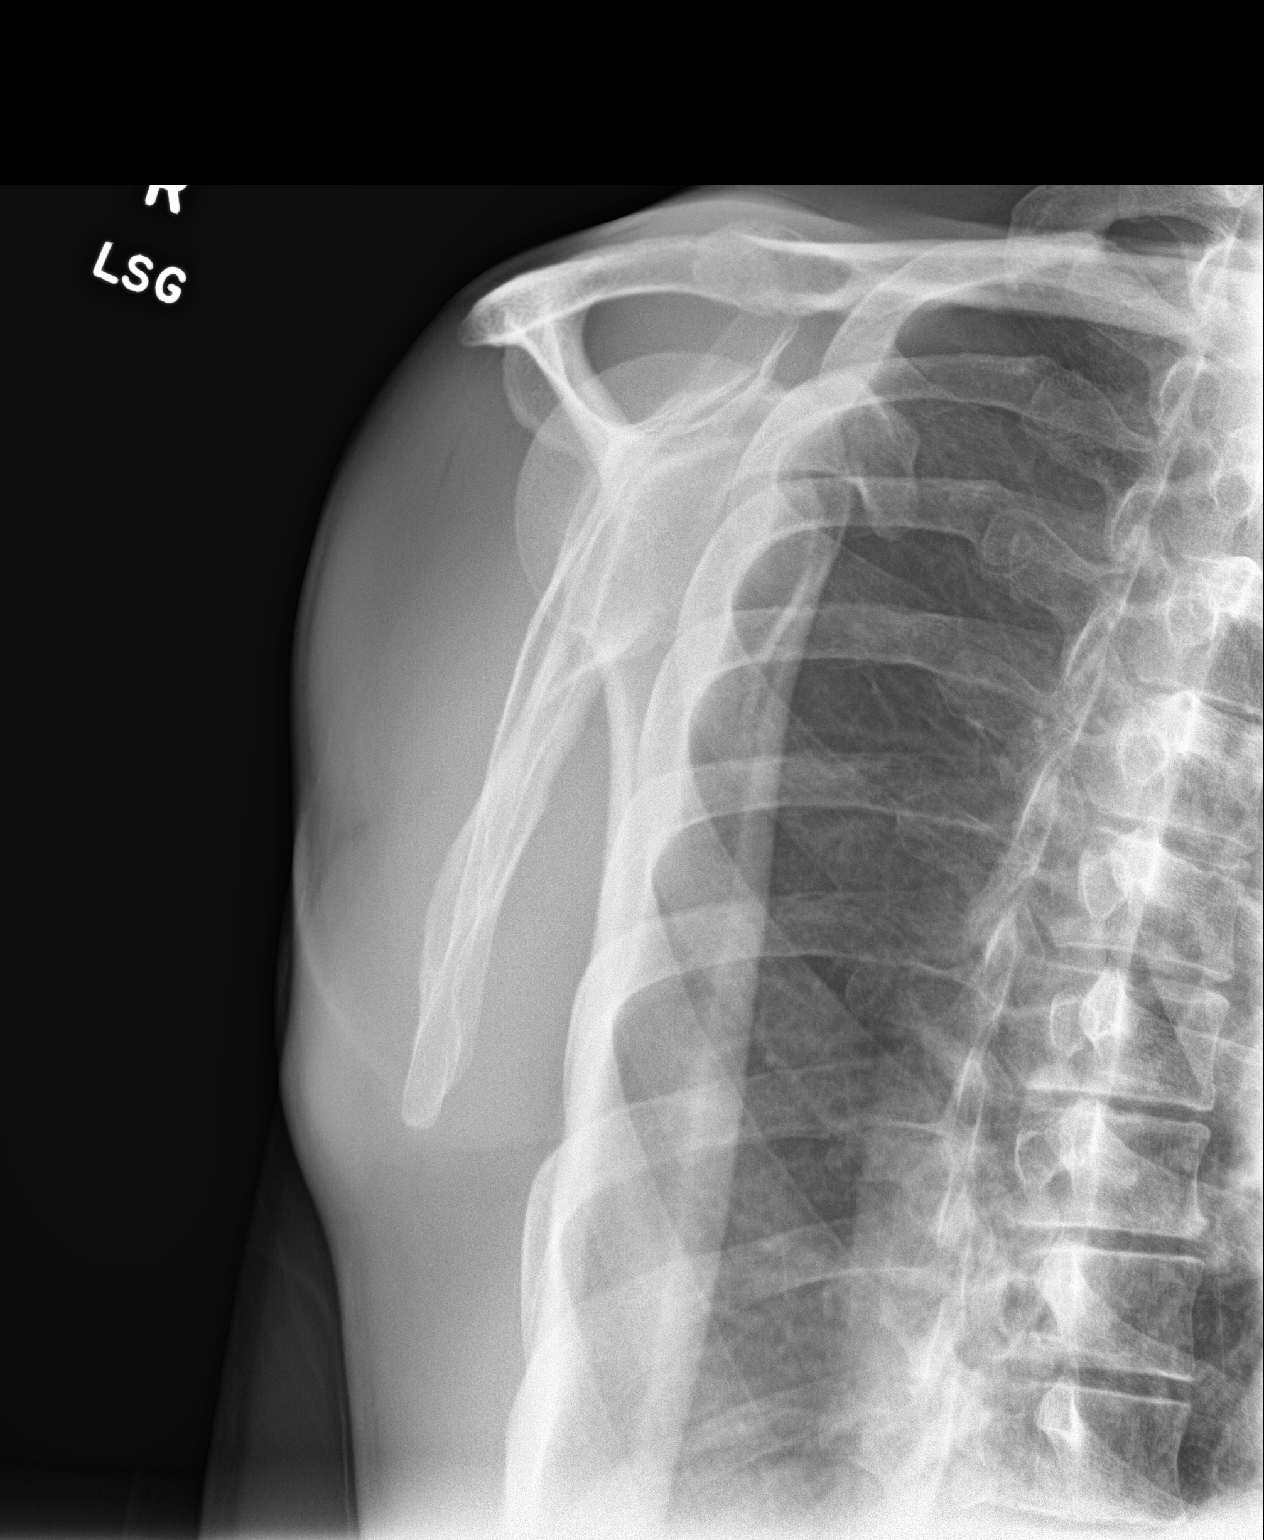

[3 of 3 positions shown; findings below may reference images not displayed]

FINDINGS: There is no evidence of fracture or dislocation. There is no
evidence of arthropathy or other focal bone abnormality. Soft
tissues are unremarkable.
IMPRESSION: Negative exam.
